# Patient Record
Sex: Female | Born: 1935 | Race: White | Hispanic: No | Smoking: Never smoker
Health system: Southern US, Community
[De-identification: ages and names within clinical notes are randomized; demographics above are authoritative.]

## PROBLEM LIST (undated history)

## (undated) DIAGNOSIS — I10 Essential (primary) hypertension: Secondary | ICD-10-CM

## (undated) DIAGNOSIS — R42 Dizziness and giddiness: Secondary | ICD-10-CM

## (undated) DIAGNOSIS — I219 Acute myocardial infarction, unspecified: Secondary | ICD-10-CM

## (undated) HISTORY — PX: HIP SURGERY: SHX245

---

## 2010-11-26 ENCOUNTER — Ambulatory Visit: Payer: Self-pay | Admitting: Family Medicine

## 2011-12-11 ENCOUNTER — Ambulatory Visit: Payer: Self-pay | Admitting: Family Medicine

## 2014-07-07 ENCOUNTER — Emergency Department: Payer: Self-pay | Admitting: Emergency Medicine

## 2014-07-07 ENCOUNTER — Emergency Department: Payer: Self-pay | Admitting: Student

## 2014-07-07 LAB — CBC WITH DIFFERENTIAL/PLATELET
BASOS ABS: 0.1 10*3/uL (ref 0.0–0.1)
Basophil %: 1 %
Eosinophil #: 0.1 10*3/uL (ref 0.0–0.7)
Eosinophil %: 1.4 %
HCT: 40.1 % (ref 35.0–47.0)
HGB: 13.1 g/dL (ref 12.0–16.0)
LYMPHS ABS: 1.3 10*3/uL (ref 1.0–3.6)
Lymphocyte %: 13.9 %
MCH: 31.5 pg (ref 26.0–34.0)
MCHC: 32.7 g/dL (ref 32.0–36.0)
MCV: 96 fL (ref 80–100)
MONO ABS: 0.6 x10 3/mm (ref 0.2–0.9)
MONOS PCT: 6.1 %
NEUTROS ABS: 7.2 10*3/uL — AB (ref 1.4–6.5)
Neutrophil %: 77.6 %
PLATELETS: 208 10*3/uL (ref 150–440)
RBC: 4.16 10*6/uL (ref 3.80–5.20)
RDW: 13.8 % (ref 11.5–14.5)
WBC: 9.3 10*3/uL (ref 3.6–11.0)

## 2014-07-07 LAB — COMPREHENSIVE METABOLIC PANEL
ALK PHOS: 49 U/L
ALT: 14 U/L
ANION GAP: 9 (ref 7–16)
Albumin: 4.1 g/dL
BUN: 31 mg/dL — AB
Bilirubin,Total: 0.4 mg/dL
CALCIUM: 9.3 mg/dL
CHLORIDE: 105 mmol/L
CO2: 26 mmol/L
Creatinine: 1.05 mg/dL — ABNORMAL HIGH
EGFR (African American): 55 — ABNORMAL LOW
GFR CALC NON AF AMER: 48 — AB
Glucose: 137 mg/dL — ABNORMAL HIGH
POTASSIUM: 3.8 mmol/L
SGOT(AST): 26 U/L
Sodium: 140 mmol/L
Total Protein: 7 g/dL

## 2014-07-07 LAB — URINALYSIS, COMPLETE
BLOOD: NEGATIVE
Bacteria: NONE SEEN
Bilirubin,UR: NEGATIVE
Glucose,UR: NEGATIVE mg/dL (ref 0–75)
Ketone: NEGATIVE
LEUKOCYTE ESTERASE: NEGATIVE
NITRITE: NEGATIVE
PH: 5 (ref 4.5–8.0)
Protein: NEGATIVE
RBC,UR: 1 /HPF (ref 0–5)
Specific Gravity: 1.014 (ref 1.003–1.030)
WBC UR: 2 /HPF (ref 0–5)

## 2014-07-07 LAB — TROPONIN I: Troponin-I: 0.03 ng/mL

## 2014-07-07 LAB — LIPASE, BLOOD: LIPASE: 35 U/L

## 2015-02-04 ENCOUNTER — Emergency Department: Payer: Medicare Other

## 2015-02-04 ENCOUNTER — Emergency Department
Admission: EM | Admit: 2015-02-04 | Discharge: 2015-02-04 | Disposition: A | Discharge status: Home / Self Care | Payer: Medicare Other | Attending: Emergency Medicine | Admitting: Emergency Medicine

## 2015-02-04 ENCOUNTER — Encounter: Payer: Self-pay | Admitting: Adult Health

## 2015-02-04 DIAGNOSIS — Y998 Other external cause status: Secondary | ICD-10-CM | POA: Diagnosis not present

## 2015-02-04 DIAGNOSIS — Z79899 Other long term (current) drug therapy: Secondary | ICD-10-CM | POA: Diagnosis not present

## 2015-02-04 DIAGNOSIS — W19XXXA Unspecified fall, initial encounter: Secondary | ICD-10-CM

## 2015-02-04 DIAGNOSIS — Y9389 Activity, other specified: Secondary | ICD-10-CM | POA: Insufficient documentation

## 2015-02-04 DIAGNOSIS — S0093XA Contusion of unspecified part of head, initial encounter: Secondary | ICD-10-CM

## 2015-02-04 DIAGNOSIS — S6992XA Unspecified injury of left wrist, hand and finger(s), initial encounter: Secondary | ICD-10-CM | POA: Insufficient documentation

## 2015-02-04 DIAGNOSIS — S7012XA Contusion of left thigh, initial encounter: Secondary | ICD-10-CM | POA: Insufficient documentation

## 2015-02-04 DIAGNOSIS — Z7982 Long term (current) use of aspirin: Secondary | ICD-10-CM | POA: Insufficient documentation

## 2015-02-04 DIAGNOSIS — S0083XA Contusion of other part of head, initial encounter: Secondary | ICD-10-CM | POA: Insufficient documentation

## 2015-02-04 DIAGNOSIS — S7002XA Contusion of left hip, initial encounter: Secondary | ICD-10-CM | POA: Insufficient documentation

## 2015-02-04 DIAGNOSIS — Y9289 Other specified places as the place of occurrence of the external cause: Secondary | ICD-10-CM | POA: Insufficient documentation

## 2015-02-04 DIAGNOSIS — W01198A Fall on same level from slipping, tripping and stumbling with subsequent striking against other object, initial encounter: Secondary | ICD-10-CM | POA: Insufficient documentation

## 2015-02-04 DIAGNOSIS — S0990XA Unspecified injury of head, initial encounter: Secondary | ICD-10-CM | POA: Diagnosis present

## 2015-02-04 HISTORY — DX: Acute myocardial infarction, unspecified: I21.9

## 2015-02-04 HISTORY — DX: Dizziness and giddiness: R42

## 2015-02-04 NOTE — ED Provider Notes (Signed)
[facility] Emergency Department Provider Note  ____________________________________________  Time seen: Approximately 6:39 AM  I have reviewed the triage vital signs and the nursing notes.   HISTORY  Chief Complaint Fall    HPI Leslie Huffman is a 81 y.o. female with past medical history that includes multiple orthopedic injuries but who takes no blood thinners and is relatively healthy for her age presents with pain in her left wrist, left hip, and the left side of the back of her head after rolling out of bed and striking the hardwood floor.  Reportedly she was trying to get up to go to the restroom when she slipped and fell.  She did not lose consciousness and denies any significant headache at this time.  She has no tenderness in the back of her neck or with any movement of her head and neck.  She has no obvious deformities of her extremities, just mild to moderate tenderness with range of motion of the major joints of the limbs described above.  She is concerned about her left hip that she has had a hip replacement in the past.  She is very comfortable at this time and denies any pain as long she is sitting still.  Movement makes the pain worse and remaining still makes it better.  She denies lightheadedness, dizziness, chest pain, shortness of breath, fever/chills, abdominal pain, dysuria.   Past Medical History  Diagnosis Date  . Vertigo   . Heart attack (HCC)     There are no active problems to display for this patient.   Past Surgical History  Procedure Laterality Date  . Hip surgery      Current Outpatient Rx  Name  Route  Sig  Dispense  Refill  . acetaminophen (TYLENOL) 650 MG CR tablet   Oral   Take 1,300 mg by mouth every 8 (eight) hours.         Marland Kitchen. aspirin EC 81 MG tablet   Oral   Take 1 tablet by mouth daily.         Marland Kitchen. docusate sodium (COLACE) 50 MG capsule   Oral   Take 50 mg by mouth 2 (two) times daily as needed for mild  constipation.         . fluticasone (FLONASE) 50 MCG/ACT nasal spray   Each Nare   Place 2 sprays into both nostrils daily.         Marland Kitchen. levothyroxine (SYNTHROID, LEVOTHROID) 50 MCG tablet   Oral   Take 1 tablet by mouth daily.         Marland Kitchen. lisinopril (PRINIVIL,ZESTRIL) 20 MG tablet   Oral   Take 1 tablet by mouth daily.         . meclizine (ANTIVERT) 25 MG tablet   Oral   Take 25-50 mg by mouth 4 (four) times daily as needed.         . metoprolol succinate (TOPROL-XL) 50 MG 24 hr tablet   Oral   Take 1 tablet by mouth daily.         . Multiple Vitamins-Minerals (OCUVITE ADULT 50+) CAPS   Oral   Take 1 capsule by mouth daily.         . ondansetron (ZOFRAN) 4 MG tablet   Oral   Take 1 tablet by mouth every 6 (six) hours as needed.         . triamterene-hydrochlorothiazide (MAXZIDE-25) 37.5-25 MG tablet   Oral   Take 1 tablet by mouth  daily.           Allergies Sulfa antibiotics  History reviewed. No pertinent family history.  Social History Social History  Substance Use Topics  . Smoking status: Never Smoker   . Smokeless tobacco: None  . Alcohol Use: No    Review of Systems Constitutional: No fever/chills Eyes: No visual changes. ENT: No sore throat. Cardiovascular: Denies chest pain. Respiratory: Denies shortness of breath. Gastrointestinal: No abdominal pain.  No nausea, no vomiting.  No diarrhea.  No constipation. Genitourinary: Negative for dysuria. Musculoskeletal: Pain/tenderness in her left wrist, left hip, and the left-sided back of her head Skin: Negative for rash. Neurological: Negative for headaches, focal weakness or numbness.  10-point ROS otherwise negative.  ____________________________________________   PHYSICAL EXAM:  VITAL SIGNS: ED Triage Vitals  Enc Vitals Group     BP 02/04/15 0308 182/73 mmHg     Pulse Rate 02/04/15 0308 54     Resp 02/04/15 0308 18     Temp 02/04/15 0308 99.3 F (37.4 C)     Temp Source  02/04/15 0308 Oral     SpO2 02/04/15 0308 95 %     Weight --      Height --      Head Cir --      Peak Flow --      Pain Score 02/04/15 0308 1     Pain Loc --      Pain Edu? --      Excl. in GC? --     Constitutional: Alert and oriented. Well appearing and in no acute distress. Eyes: Conjunctivae are normal. PERRL. EOMI. Head: Small contusion/hematoma posterior to her left ear near the left side of the back of her head.  No hemotympanum. Nose: No congestion/rhinnorhea. Mouth/Throat: Mucous membranes are moist.  Oropharynx non-erythematous. Neck: No stridor.  No cervical spine tenderness to palpation. Cardiovascular: Normal rate, regular rhythm. Grossly normal heart sounds.  Good peripheral circulation. Respiratory: Normal respiratory effort.  No retractions. Lungs CTAB. Gastrointestinal: Soft and nontender. No distention. No abdominal bruits. No CVA tenderness. Musculoskeletal: No gross deformities.  No pain or tenderness with range of motion of the affected joints.  Neurovascularly intact throughout. Neurologic:  Normal speech and language. No gross focal neurologic deficits are appreciated.  Skin:  Skin is warm, dry and intact. No rash noted. Psychiatric: Mood and affect are normal. Speech and behavior are normal.  ____________________________________________   LABS (all labs ordered are listed, but only abnormal results are displayed)  Labs Reviewed - No data to display ____________________________________________  EKG  Not indicated ____________________________________________  RADIOLOGY   Dg Wrist Complete Left  02/04/2015  CLINICAL DATA:  Left wrist pain after fall out of bed. EXAM: LEFT WRIST - COMPLETE 3+ VIEW COMPARISON:  None. FINDINGS: No acute fracture or dislocation. 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 Well corticated osseous density about the volar proximal carpal row, may reflect sequela of remote prior injury. No focal soft tissue abnormality.  IMPRESSION: No fracture or dislocation. 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 Electronically Signed   By: Rubye Oaks M.D.   On: 02/04/2015 03:56   Ct Head Wo Contrast  02/04/2015  CLINICAL DATA:  Rolled out of bed onto hardwood floor, with headache. Disoriented. Initial encounter. EXAM: CT HEAD WITHOUT CONTRAST TECHNIQUE: Contiguous axial images were obtained from the base of the skull through the vertex without intravenous contrast. COMPARISON:  CT of the head performed 07/07/2014 FINDINGS: There is no evidence  of acute infarction, mass lesion, or intra- or extra-axial hemorrhage on CT. Prominence of ventricles and sulci reflects moderate cortical volume loss. Cerebellar atrophy is noted. Mild periventricular and subcortical white matter change likely reflects small vessel ischemic microangiopathy. The brainstem and fourth ventricle are within normal limits. The basal ganglia are unremarkable in appearance. The cerebral hemispheres demonstrate grossly normal gray-white differentiation. No mass effect or midline shift is seen. There is no evidence of fracture; visualized osseous structures are unremarkable in appearance. The visualized portions of the orbits are within normal limits. The paranasal sinuses and mastoid air cells are well-aerated. No significant soft tissue abnormalities are seen. IMPRESSION: 1. No evidence of traumatic intracranial injury or fracture. 2. Moderate cortical volume loss and scattered small vessel ischemic microangiopathy. Electronically Signed   By: Roanna Raider M.D.   On: 02/04/2015 04:08   Dg Hip Unilat With Pelvis 2-3 Views Left  02/04/2015  CLINICAL DATA:  Acute onset of left lateral hip pain after rolling on bed onto hardwood floor. Initial encounter. EXAM: DG HIP (WITH OR WITHOUT PELVIS) 2-3V LEFT COMPARISON:  None. FINDINGS: There is no evidence of fracture or dislocation. The left femoral head remains seated at the acetabulum. Left femoral  hardware appears grossly intact, without evidence of loosening. Mild degenerative change is noted at the pubic symphysis. Mild joint space narrowing is seen at the right hip. Mild sclerotic change is noted at the sacroiliac joints. Mild degenerative change is noted at the lower lumbar spine. The visualized bowel gas pattern is grossly unremarkable. IMPRESSION: No evidence of fracture or dislocation. Left femoral hardware appears grossly intact, without evidence of loosening. Electronically Signed   By: Roanna Raider M.D.   On: 02/04/2015 03:55    ____________________________________________   PROCEDURES  Procedure(s) performed: None  Critical Care performed: No ____________________________________________   INITIAL IMPRESSION / ASSESSMENT AND PLAN / ED COURSE  Pertinent labs & imaging results that were available during my care of the patient were reviewed by me and considered in my medical decision making (see chart for details).  The patient is well-appearing and in no acute distress.  Her radiographs are reassuring.  There is no indication at this time to proceed with further evaluation.  The patient and her son agree with this plan.  I gave her my usual and customary return precautions.  ____________________________________________  FINAL CLINICAL IMPRESSION(S) / ED DIAGNOSES  Final diagnoses:  Fall      NEW MEDICATIONS STARTED DURING THIS VISIT:  New Prescriptions   No medications on file     Loleta Rose, MD 02/04/15 [phone]

## 2015-02-04 NOTE — ED Notes (Addendum)
Presents with wrist paain, left hip pain and head pain from rolling out of bed onto hard wood floot this evening. FAmily came to home and stated she was slightly disoriented . At this time alert, oreinted, answers all questions appropriately. denis use of blood thinners. No deformity to wrist, no shortening or rotation.  Endorses nausea.

## 2015-02-04 NOTE — Discharge Instructions (Signed)
You have been seen in the Emergency Department (ED) today for a fall.  Your work up does not show any concerning injuries.  Please take over-the-counter ibuprofen and/or Tylenol as needed for your pain (unless you have an allergy or your doctor as told you not to take them), or take any prescribed medication as instructed.  Please follow up with your doctor regarding today's Emergency Department (ED) visit and your recent fall.    Return to the ED if you have any headache, confusion, slurred speech, weakness/numbness of any arm or leg, or any increased pain.   Contusion A contusion is a deep bruise. Contusions are the result of a blunt injury to tissues and muscle fibers under the skin. The injury causes bleeding under the skin. The skin overlying the contusion may turn blue, purple, or yellow. Minor injuries will give you a painless contusion, but more severe contusions may stay painful and swollen for a few weeks.  CAUSES  This condition is usually caused by a blow, trauma, or direct force to an area of the body. SYMPTOMS  Symptoms of this condition include:  Swelling of the injured area.  Pain and tenderness in the injured area.  Discoloration. The area may have redness and then turn blue, purple, or yellow. DIAGNOSIS  This condition is diagnosed based on a physical exam and medical history. An X-ray, CT scan, or MRI may be needed to determine if there are any associated injuries, such as broken bones (fractures). TREATMENT  Specific treatment for this condition depends on what area of the body was injured. In general, the best treatment for a contusion is resting, icing, applying pressure to (compression), and elevating the injured area. This is often called the RICE strategy. Over-the-counter anti-inflammatory medicines may also be recommended for pain control.  HOME CARE INSTRUCTIONS   Rest the injured area.  If directed, apply ice to the injured area:  Put ice in a plastic  bag.  Place a towel between your skin and the bag.  Leave the ice on for 20 minutes, 2-3 times per day.  If directed, apply light compression to the injured area using an elastic bandage. Make sure the bandage is not wrapped too tightly. Remove and reapply the bandage as directed by your health care provider.  If possible, raise (elevate) the injured area above the level of your heart while you are sitting or lying down.  Take over-the-counter and prescription medicines only as told by your health care provider. SEEK MEDICAL CARE IF:  Your symptoms do not improve after several days of treatment.  Your symptoms get worse.  You have difficulty moving the injured area. SEEK IMMEDIATE MEDICAL CARE IF:   You have severe pain.  You have numbness in a hand or foot.  Your hand or foot turns pale or cold.   This information is not intended to replace advice given to you by your health care provider. Make sure you discuss any questions you have with your health care provider.   Document Released: 01/08/2005 Document Revised: 12/20/2014 Document Reviewed: 08/16/2014 Elsevier Interactive Patient Education 2016 [employer]  Facial or Scalp Contusion  A facial or scalp contusion is a deep bruise on the face or head. Contusions happen when an injury causes bleeding under the skin. Signs of bruising include pain, puffiness (swelling), and discolored skin. The contusion may turn blue, purple, or yellow. HOME CARE  Only take medicines as told by your doctor.  Put ice on the injured area.  Put ice in a plastic bag.  Place a towel between your skin and the bag.  Leave the ice on for 20 minutes, 2-3 times a day. GET HELP IF:  You have bite problems.  You have pain when chewing.  You are worried about your face not healing normally. GET HELP RIGHT AWAY IF:   You have severe pain or a headache and medicine does not help.  You are very tired or confused, or your personality  changes.  You throw up (vomit).  You have a nosebleed that will not stop.  You see two of everything (double vision) or have blurry vision.  You have fluid coming from your nose or ear.  You have problems walking or using your arms or legs. MAKE SURE YOU:   Understand these instructions.  Will watch your condition.  Will get help right away if you are not doing well or get worse.   This information is not intended to replace advice given to you by your health care provider. Make sure you discuss any questions you have with your health care provider.   Document Released: 03/20/2011 Document Revised: 04/21/2014 Document Reviewed: 11/11/2012 Elsevier Interactive Patient Education [employer] Elsevier Inc[employer]

## 2016-08-07 ENCOUNTER — Encounter: Payer: Self-pay | Admitting: Podiatry

## 2016-08-07 ENCOUNTER — Ambulatory Visit (INDEPENDENT_AMBULATORY_CARE_PROVIDER_SITE_OTHER): Payer: Medicare Other | Admitting: Podiatry

## 2016-08-07 VITALS — BP 149/75 | HR 64

## 2016-08-07 DIAGNOSIS — B351 Tinea unguium: Secondary | ICD-10-CM | POA: Diagnosis not present

## 2016-08-07 DIAGNOSIS — M79676 Pain in unspecified toe(s): Secondary | ICD-10-CM

## 2016-08-07 NOTE — Progress Notes (Signed)
   Subjective:    Patient ID: Leslie Huffman, female    DOB: 1924-08-09, 83 y.o.   MRN: [id number]  HPI This patient presents the office saying that her nails are thick and painful. She says that she is unable to self treat due to hip surgery on her left hip. She says the nails are painful walking and wearing her shoes. She presents the office today for an evaluation and treatment of her long thick nails   Review of Systems  All other systems reviewed and are negative.      Objective:   Physical Exam GENERAL APPEARANCE: Alert, conversant. Appropriately groomed. No acute distress.  VASCULAR: Pedal pulses are  palpable at   [facility] and PT bilateral.  Capillary refill time is immediate to all digits,  Normal temperature gradient.  Digital hair growth is present bilateral  NEUROLOGIC: sensation is normal to 5.07 monofilament at 5/5 sites bilateral.  Light touch is intact bilateral, Muscle strength normal.  MUSCULOSKELETAL: acceptable muscle strength, tone and stability bilateral.  HAV  B/L  DERMATOLOGIC: skin color, texture, and turgor are within normal limits.  No preulcerative lesions or ulcers  are seen, no interdigital maceration noted.  No open lesions present.   No drainage noted.  NAILS  Thick disfigured discolored nails both feet.         Assessment & Plan:  Onychomycosis  B/L  IE  Debride nails  B/L  RTC 3 months.   Helane Gunther DPM

## 2016-11-06 ENCOUNTER — Encounter: Payer: Self-pay | Admitting: Podiatry

## 2016-11-06 ENCOUNTER — Ambulatory Visit (INDEPENDENT_AMBULATORY_CARE_PROVIDER_SITE_OTHER): Payer: Medicare Other | Admitting: Podiatry

## 2016-11-06 DIAGNOSIS — B351 Tinea unguium: Secondary | ICD-10-CM

## 2016-11-06 DIAGNOSIS — M79676 Pain in unspecified toe(s): Secondary | ICD-10-CM

## 2016-11-06 NOTE — Progress Notes (Signed)
   Subjective:    Patient ID: Clydie BraunMary S Waldridge, female    DOB: 09/27/1924, 83 y.o.   MRN: [id number]  HPI This patient presents the office saying that her nails are thick and painful. She says that she is unable to self treat due to hip surgery on her left hip. She says the nails are painful walking and wearing her shoes. She presents the office today for an evaluation and treatment of her long thick nails   Review of Systems  All other systems reviewed and are negative.      Objective:   Physical Exam GENERAL APPEARANCE: Alert, conversant. Appropriately groomed. No acute distress.  VASCULAR: Pedal pulses are  palpable at  [facility].  Capillary refill time is immediate to all digits,  Normal temperature gradient.  NEUROLOGIC: sensation is normal to 5.07 monofilament at 5/5 sites bilateral.  Light touch is intact bilateral, Muscle strength normal.  MUSCULOSKELETAL: acceptable muscle strength, tone and stability bilateral.  HAV  B/L  DERMATOLOGIC: skin color, texture, and turgor are within normal limits.  No preulcerative lesions or ulcers  are seen, no interdigital maceration noted.  No open lesions present.   No drainage noted.  NAILS  Thick disfigured discolored  Hallux  nails both feet.         Assessment & Plan:  Onychomycosis  B/L  IE  Debride nails  B/L  RTC 3 months.   Helane GuntherGregory Travanti Mcmanus DPM

## 2017-01-29 ENCOUNTER — Encounter: Payer: Self-pay | Admitting: Podiatry

## 2017-01-29 ENCOUNTER — Ambulatory Visit (INDEPENDENT_AMBULATORY_CARE_PROVIDER_SITE_OTHER): Payer: Medicare Other | Admitting: Podiatry

## 2017-01-29 DIAGNOSIS — B351 Tinea unguium: Secondary | ICD-10-CM | POA: Diagnosis not present

## 2017-01-29 DIAGNOSIS — M79676 Pain in unspecified toe(s): Secondary | ICD-10-CM | POA: Diagnosis not present

## 2017-01-29 NOTE — Progress Notes (Addendum)
   Subjective:    Patient ID: Leslie Huffman, female    DOB: [birth year], 83 y.o.   MRN: [id number]  HPI This patient presents the office saying that her nails are thick and painful. She says that she is unable to self treat due to hip surgery on her left hip. She says the nails are painful walking and wearing her shoes. She presents the office today for an evaluation and treatment of her long thick nails   Review of Systems  All other systems reviewed and are negative.      Objective:   Physical Exam GENERAL APPEARANCE: Alert, conversant. Appropriately groomed. No acute distress.  VASCULAR: Pedal pulses are  palpable at  [facility].  Capillary refill time is immediate to all digits,  Normal temperature gradient.  NEUROLOGIC: sensation is normal to 5.07 monofilament at 5/5 sites bilateral.  Light touch is intact bilateral, Muscle strength normal.  MUSCULOSKELETAL: acceptable muscle strength, tone and stability bilateral.  HAV  B/L  DERMATOLOGIC: skin color, texture, and turgor are within normal limits.  No preulcerative lesions or ulcers  are seen, no interdigital maceration noted.  No open lesions present.   No drainage noted.  NAILS  Thick disfigured discolored  Hallux  nails both feet.         Assessment & Plan:  Onychomycosis  B/L  Debride nails  B/L  RTC 3 months.  ABN signed for 2018.   Helane GuntherGregory Vincenza Dail DPM

## 2017-02-28 ENCOUNTER — Emergency Department
Admission: EM | Admit: 2017-02-28 | Discharge: 2017-02-28 | Disposition: A | Discharge status: Home / Self Care | Payer: Medicare Other | Attending: Emergency Medicine | Admitting: Emergency Medicine

## 2017-02-28 ENCOUNTER — Encounter: Payer: Self-pay | Admitting: Emergency Medicine

## 2017-02-28 ENCOUNTER — Other Ambulatory Visit: Payer: Self-pay

## 2017-02-28 ENCOUNTER — Emergency Department: Payer: Medicare Other

## 2017-02-28 DIAGNOSIS — S7002XA Contusion of left hip, initial encounter: Secondary | ICD-10-CM | POA: Insufficient documentation

## 2017-02-28 DIAGNOSIS — S51812A Laceration without foreign body of left forearm, initial encounter: Secondary | ICD-10-CM | POA: Insufficient documentation

## 2017-02-28 DIAGNOSIS — Y999 Unspecified external cause status: Secondary | ICD-10-CM | POA: Insufficient documentation

## 2017-02-28 DIAGNOSIS — I10 Essential (primary) hypertension: Secondary | ICD-10-CM | POA: Diagnosis not present

## 2017-02-28 DIAGNOSIS — Z79899 Other long term (current) drug therapy: Secondary | ICD-10-CM | POA: Insufficient documentation

## 2017-02-28 DIAGNOSIS — R52 Pain, unspecified: Secondary | ICD-10-CM

## 2017-02-28 DIAGNOSIS — W19XXXA Unspecified fall, initial encounter: Secondary | ICD-10-CM

## 2017-02-28 DIAGNOSIS — W01198A Fall on same level from slipping, tripping and stumbling with subsequent striking against other object, initial encounter: Secondary | ICD-10-CM | POA: Insufficient documentation

## 2017-02-28 DIAGNOSIS — Z7982 Long term (current) use of aspirin: Secondary | ICD-10-CM | POA: Insufficient documentation

## 2017-02-28 DIAGNOSIS — Y9389 Activity, other specified: Secondary | ICD-10-CM | POA: Insufficient documentation

## 2017-02-28 DIAGNOSIS — S59912A Unspecified injury of left forearm, initial encounter: Secondary | ICD-10-CM | POA: Diagnosis present

## 2017-02-28 DIAGNOSIS — Y929 Unspecified place or not applicable: Secondary | ICD-10-CM | POA: Diagnosis not present

## 2017-02-28 HISTORY — DX: Essential (primary) hypertension: I10

## 2017-02-28 NOTE — ED Triage Notes (Signed)
Lost balance and fell approx 1 hour ago, small laceration L forearm with minimal bleeding at present. Covered with gauze in triage.

## 2017-02-28 NOTE — ED Provider Notes (Signed)
[facility] Emergency Department Provider Note   ____________________________________________   None    (approximate)  I have reviewed the triage vital signs and the nursing notes.   HISTORY  Chief Complaint Laceration    HPI Leslie Huffman is a 83 y.o. female patient presents with a skin tear to the left forearm which happened approximately one hour ago. Patient states she loss of balance in toto skin against a dresser. Patient stated there is mild to moderate left hip pain secondary to fall. Minimal bleeding noted and was controlled with direct pressure. Patient denies loss of sensation or loss of function of the affected extremity. Patient rates the pain as a 1/10.  Past Medical History:  Diagnosis Date  . Heart attack (HCC)   . Hypertension   . Vertigo     There are no active problems to display for this patient.   Past Surgical History:  Procedure Laterality Date  . HIP SURGERY      Prior to Admission medications   Medication Sig Start Date End Date Taking? Authorizing Provider  acetaminophen (TYLENOL) 650 MG CR tablet Take 1,300 mg by mouth every 8 (eight) hours.    [provider]  aspirin EC 81 MG tablet Take 1 tablet by mouth daily.    [provider]  docusate sodium (COLACE) 50 MG capsule Take 50 mg by mouth 2 (two) times daily as needed for mild constipation.    [provider]  fluticasone (FLONASE) 50 MCG/ACT nasal spray Place 2 sprays into both nostrils daily.    [provider]  levothyroxine (SYNTHROID, LEVOTHROID) 50 MCG tablet Take 1 tablet by mouth daily. 02/02/15   [provider]  lisinopril (PRINIVIL,ZESTRIL) 20 MG tablet Take 1 tablet by mouth daily. 02/02/15   [provider]  meclizine (ANTIVERT) 25 MG tablet Take 25-50 mg by mouth 4 (four) times daily as needed. 09/18/14   [provider]  metoprolol succinate (TOPROL-XL) 50 MG 24 hr tablet Take 1 tablet by mouth  daily. 02/02/15   [provider]  Multiple Vitamins-Minerals (OCUVITE ADULT 50+) CAPS Take 1 capsule by mouth daily.    [provider]  ondansetron (ZOFRAN) 4 MG tablet Take 1 tablet by mouth every 6 (six) hours as needed. 09/18/14   [provider]  triamterene-hydrochlorothiazide (MAXZIDE-25) 37.5-25 MG tablet Take 1 tablet by mouth daily. 02/02/15   [provider]    Allergies Sulfa antibiotics  No family history on file.  Social History Social History   Tobacco Use  . Smoking status: Never Smoker  . Smokeless tobacco: Never Used  Substance Use Topics  . Alcohol use: No  . Drug use: No    Review of Systems  Constitutional: No fever/chills Eyes: No visual changes. ENT: No sore throat. Cardiovascular: Denies chest pain. Respiratory: Denies shortness of breath. Gastrointestinal: No abdominal pain.  No nausea, no vomiting.  No diarrhea.  No constipation. Genitourinary: Negative for dysuria. Musculoskeletal: Negative for back pain. Skin: Negative for rash. Neurological: Negative for headaches, focal weakness or numbness. Vertigo Endocrine:Hypertension Allergic/Immunilogical: Sulfa antibiotics  ____________________________________________   PHYSICAL EXAM:  VITAL SIGNS: ED Triage Vitals  Enc Vitals Group     BP 02/28/17 1746 (!) 151/57     Pulse Rate 02/28/17 1746 60     Resp 02/28/17 1746 18     Temp 02/28/17 1746 99 F (37.2 C)     Temp Source 02/28/17 1746 Oral     SpO2 02/28/17 1746  94 %     Weight 02/28/17 1749 136 lb (61.7 kg)     Height 02/28/17 1749 5\' 2"  (1.575 m)     Head Circumference --      Peak Flow --      Pain Score 02/28/17 1821 1     Pain Loc --      Pain Edu? --      Excl. in GC? --     Constitutional: Alert and oriented. Well appearing and in no acute distress. Eyes: Conjunctivae are normal. PERRL. EOMI. Head: Atraumatic. Neck: No stridor.  No cervical spine tenderness to  palpation. Cardiovascular: Normal rate, regular rhythm. Grossly normal heart sounds.  Good peripheral circulation. Respiratory: Normal respiratory effort.  No retractions. Lungs CTAB. Musculoskeletal: No lower extremity tenderness nor edema.  No joint effusions. Neurologic:  Normal speech and language. No gross focal neurologic deficits are appreciated. No gait instability. Skin:  Skin is warm, dry and intact. No rash noted. 2.5 cm skin tear left forearm Psychiatric: Mood and affect are normal. Speech and behavior are normal.  ____________________________________________   LABS (all labs ordered are listed, but only abnormal results are displayed)  Labs Reviewed - No data to display ____________________________________________  EKG   ____________________________________________  RADIOLOGY  Dg Hip Unilat With Pelvis 2-3 Views Left  Result Date: 02/28/2017 CLINICAL DATA:  C/o left hip pain after losing balance and falling this PM EXAM: DG HIP (WITH OR WITHOUT PELVIS) 2-3V LEFT COMPARISON:  Plain film of the pelvis and left hip dated 02/04/2015. FINDINGS: Intramedullary rods and fixation screws in the proximal left femur appear intact and stable in alignment. No evidence of hardware loosening. No evidence of acute osseous fracture or dislocation at the left hip. Left femoral head remains well positioned relative to the acetabulum. Remainder of the osseous pelvis also appears intact and normally aligned. Soft tissues about the pelvis and left hip are unremarkable. IMPRESSION: No acute findings. No acute osseous fracture or dislocation. Fixation hardware at the left hip appears intact and stable in position. Electronically Signed   By: Bary Richard M.D.   On: 02/28/2017 19:19    ____________________________________________   PROCEDURES  Procedure(s) performed: None  .Marland KitchenLaceration Repair Date/Time: 02/28/2017 6:36 PM Performed by: Joni Reining, PA-C Authorized by: Joni Reining,  PA-C   Consent:    Consent obtained:  Verbal   Consent given by:  Patient   Risks discussed:  Infection Laceration details:    Location:  Shoulder/arm   Shoulder/arm location:  L lower arm   Length (cm):  2 Repair type:    Repair type:  Simple Pre-procedure details:    Preparation:  Patient was prepped and draped in usual sterile fashion Exploration:    Hemostasis achieved with:  Direct pressure   Wound extent: no foreign bodies/material noted, no muscle damage noted, no nerve damage noted, no tendon damage noted, no underlying fracture noted and no vascular damage noted     Contaminated: no   Treatment:    Area cleansed with:  Betadine and saline   Amount of cleaning:  Standard   Irrigation volume:  20 ML's   Irrigation method:  Syringe Skin repair:    Repair method:  Tissue adhesive Approximation:    Approximation:  Close Post-procedure details:    Dressing:  Adhesive bandage   Patient tolerance of procedure:  Tolerated well, no immediate complications    Critical Care performed: No  ____________________________________________   INITIAL IMPRESSION / ASSESSMENT AND PLAN /  ED COURSE  As part of my medical decision making, I reviewed the following data within the electronic MEDICAL RECORD NUMBER    Skin tears to left forearm. Area was surgical clean and closed with Dermabond. Discussed x-ray findings of the left hip which shows no abnormalities. Internal fixations aren't alignment. Patient given discharge care instructions. Patient advised to follow up PCP as needed.      ____________________________________________   FINAL CLINICAL IMPRESSION(S) / ED DIAGNOSES  Final diagnoses:  Forearm laceration, left, initial encounter  Contusion of left hip, initial encounter     ED Discharge Orders    None       Note:  This document was prepared using Dragon voice recognition software and may include unintentional dictation errors.    Joni ReiningSmith, Ronald K,  PA-C 02/28/17 1842    Joni ReiningSmith, Ronald K, PA-C 02/28/17 Elenore Paddy1935    McShane, James A, MD 02/28/17 [phone]

## 2017-05-04 ENCOUNTER — Ambulatory Visit: Payer: Medicare Other | Admitting: Podiatry

## 2017-05-07 ENCOUNTER — Ambulatory Visit (INDEPENDENT_AMBULATORY_CARE_PROVIDER_SITE_OTHER): Payer: Medicare Other | Admitting: Podiatry

## 2017-05-07 ENCOUNTER — Encounter: Payer: Self-pay | Admitting: Podiatry

## 2017-05-07 DIAGNOSIS — B351 Tinea unguium: Secondary | ICD-10-CM

## 2017-05-07 DIAGNOSIS — M79676 Pain in unspecified toe(s): Secondary | ICD-10-CM

## 2017-05-07 NOTE — Progress Notes (Signed)
Complaint:  Visit Type: Patient returns to my office for continued preventative foot care services. Complaint: Patient states" my nails have grown long and thick and become painful to walk and wear shoes" Patient has history of left hip surgery. The patient presents for preventative foot care services. No changes to ROS  Podiatric Exam: Vascular: dorsalis pedis and posterior tibial pulses are palpable bilateral. Capillary return is immediate. Temperature gradient is WNL. Skin turgor WNL  Sensorium: Normal Semmes Weinstein monofilament test. Normal tactile sensation bilaterally. Nail Exam: Pt has thick disfigured discolored nails with subungual debris noted bilateral entire nail hallux  Ulcer Exam: There is no evidence of ulcer or pre-ulcerative changes or infection. Orthopedic Exam: Muscle tone and strength are WNL. No limitations in general ROM. No crepitus or effusions noted. Foot type and digits show no abnormalities.  HAV  B/L. Skin: No Porokeratosis. No infection or ulcers  Diagnosis:  Onychomycosis, , Pain in right toe, pain in left toes  Treatment & Plan Procedures and Treatment: Consent by patient was obtained for treatment procedures.   Debridement of mycotic and hypertrophic toenails, 1 through 5 bilateral and clearing of subungual debris. No ulceration, no infection noted.  Return Visit-Office Procedure: Patient instructed to return to the office for a follow up visit 3 months for continued evaluation and treatment.    Deneise Getty DPM 

## 2017-08-06 ENCOUNTER — Encounter: Payer: Self-pay | Admitting: Podiatry

## 2017-08-06 ENCOUNTER — Ambulatory Visit (INDEPENDENT_AMBULATORY_CARE_PROVIDER_SITE_OTHER): Payer: Medicare Other | Admitting: Podiatry

## 2017-08-06 DIAGNOSIS — B351 Tinea unguium: Secondary | ICD-10-CM | POA: Diagnosis not present

## 2017-08-06 DIAGNOSIS — M79676 Pain in unspecified toe(s): Secondary | ICD-10-CM | POA: Diagnosis not present

## 2017-08-06 NOTE — Progress Notes (Signed)
Complaint:  Visit Type: Patient returns to my office for continued preventative foot care services. Complaint: Patient states" my nails have grown long and thick and become painful to walk and wear shoes" Patient has history of left hip surgery. The patient presents for preventative foot care services. No changes to ROS  Podiatric Exam: Vascular: dorsalis pedis and posterior tibial pulses are palpable bilateral. Capillary return is immediate. Temperature gradient is WNL. Skin turgor WNL  Sensorium: Normal Semmes Weinstein monofilament test. Normal tactile sensation bilaterally. Nail Exam: Pt has thick disfigured discolored nails with subungual debris noted bilateral entire nail hallux  Ulcer Exam: There is no evidence of ulcer or pre-ulcerative changes or infection. Orthopedic Exam: Muscle tone and strength are WNL. No limitations in general ROM. No crepitus or effusions noted. Foot type and digits show no abnormalities.  HAV  B/L. Skin: No Porokeratosis. No infection or ulcers  Diagnosis:  Onychomycosis, , Pain in right toe, pain in left toes  Treatment & Plan Procedures and Treatment: Consent by patient was obtained for treatment procedures.   Debridement of mycotic and hypertrophic toenails, 1 through 5 bilateral and clearing of subungual debris. No ulceration, no infection noted.  Return Visit-Office Procedure: Patient instructed to return to the office for a follow up visit 3 months for continued evaluation and treatment.    Loman Logan DPM 

## 2017-10-19 ENCOUNTER — Encounter: Payer: Self-pay | Admitting: Podiatry

## 2017-10-19 ENCOUNTER — Ambulatory Visit (INDEPENDENT_AMBULATORY_CARE_PROVIDER_SITE_OTHER): Payer: Medicare Other | Admitting: Podiatry

## 2017-10-19 DIAGNOSIS — B351 Tinea unguium: Secondary | ICD-10-CM

## 2017-10-19 DIAGNOSIS — M79676 Pain in unspecified toe(s): Secondary | ICD-10-CM | POA: Diagnosis not present

## 2017-10-19 NOTE — Progress Notes (Signed)
Complaint:  Visit Type: Patient returns to my office for continued preventative foot care services. Complaint: Patient states" my nails have grown long and thick and become painful to walk and wear shoes" Patient has history of left hip surgery. The patient presents for preventative foot care services. No changes to ROS  Podiatric Exam: Vascular: dorsalis pedis and posterior tibial pulses are palpable bilateral. Capillary return is immediate. Temperature gradient is WNL. Skin turgor WNL  Sensorium: Normal Semmes Weinstein monofilament test. Normal tactile sensation bilaterally. Nail Exam: Pt has thick disfigured discolored nails with subungual debris noted bilateral entire nail hallux  Ulcer Exam: There is no evidence of ulcer or pre-ulcerative changes or infection. Orthopedic Exam: Muscle tone and strength are WNL. No limitations in general ROM. No crepitus or effusions noted. Foot type and digits show no abnormalities.  HAV  B/L. Skin: No Porokeratosis. No infection or ulcers  Diagnosis:  Onychomycosis, , Pain in right toe, pain in left toes  Treatment & Plan Procedures and Treatment: Consent by patient was obtained for treatment procedures.   Debridement of mycotic and hypertrophic toenails, 1 through 5 bilateral and clearing of subungual debris. No ulceration, no infection noted.  Return Visit-Office Procedure: Patient instructed to return to the office for a follow up visit 3 months for continued evaluation and treatment.    Ashanti Ratti DPM 

## 2017-11-24 ENCOUNTER — Other Ambulatory Visit: Payer: Self-pay

## 2017-11-24 ENCOUNTER — Emergency Department: Payer: Medicare Other

## 2017-11-24 ENCOUNTER — Encounter: Payer: Self-pay | Admitting: Emergency Medicine

## 2017-11-24 ENCOUNTER — Emergency Department
Admission: EM | Admit: 2017-11-24 | Discharge: 2017-11-24 | Disposition: A | Discharge status: Home / Self Care | Payer: Medicare Other | Attending: Emergency Medicine | Admitting: Emergency Medicine

## 2017-11-24 DIAGNOSIS — E860 Dehydration: Secondary | ICD-10-CM | POA: Insufficient documentation

## 2017-11-24 DIAGNOSIS — Z7982 Long term (current) use of aspirin: Secondary | ICD-10-CM | POA: Insufficient documentation

## 2017-11-24 DIAGNOSIS — I10 Essential (primary) hypertension: Secondary | ICD-10-CM | POA: Insufficient documentation

## 2017-11-24 DIAGNOSIS — R4182 Altered mental status, unspecified: Secondary | ICD-10-CM | POA: Diagnosis present

## 2017-11-24 DIAGNOSIS — Z79899 Other long term (current) drug therapy: Secondary | ICD-10-CM | POA: Insufficient documentation

## 2017-11-24 DIAGNOSIS — R531 Weakness: Secondary | ICD-10-CM | POA: Insufficient documentation

## 2017-11-24 LAB — GLUCOSE, CAPILLARY: Glucose-Capillary: 120 mg/dL — ABNORMAL HIGH (ref 70–99)

## 2017-11-24 LAB — DIFFERENTIAL
Basophils Absolute: 0.1 10*3/uL (ref 0–0.1)
Basophils Relative: 1 %
Eosinophils Absolute: 0.3 10*3/uL (ref 0–0.7)
Eosinophils Relative: 5 %
LYMPHS PCT: 30 %
Lymphs Abs: 1.5 10*3/uL (ref 1.0–3.6)
MONO ABS: 0.6 10*3/uL (ref 0.2–0.9)
MONOS PCT: 11 %
NEUTROS ABS: 2.7 10*3/uL (ref 1.4–6.5)
Neutrophils Relative %: 53 %

## 2017-11-24 LAB — COMPREHENSIVE METABOLIC PANEL
ALK PHOS: 47 U/L (ref 38–126)
ALT: 11 U/L (ref 0–44)
AST: 19 U/L (ref 15–41)
Albumin: 3.7 g/dL (ref 3.5–5.0)
Anion gap: 9 (ref 5–15)
BILIRUBIN TOTAL: 0.7 mg/dL (ref 0.3–1.2)
BUN: 41 mg/dL — ABNORMAL HIGH (ref 8–23)
CALCIUM: 9.3 mg/dL (ref 8.9–10.3)
CO2: 27 mmol/L (ref 22–32)
CREATININE: 1.49 mg/dL — AB (ref 0.44–1.00)
Chloride: 102 mmol/L (ref 98–111)
GFR, EST AFRICAN AMERICAN: 34 mL/min — AB (ref >60)
GFR, EST NON AFRICAN AMERICAN: 30 mL/min — AB (ref >60)
Glucose, Bld: 119 mg/dL — ABNORMAL HIGH (ref 70–99)
Potassium: 3.9 mmol/L (ref 3.5–5.1)
Sodium: 138 mmol/L (ref 135–145)
Total Protein: 6.5 g/dL (ref 6.5–8.1)

## 2017-11-24 LAB — URINALYSIS, COMPLETE (UACMP) WITH MICROSCOPIC
Bacteria, UA: NONE SEEN
Bilirubin Urine: NEGATIVE
Glucose, UA: NEGATIVE mg/dL
Hgb urine dipstick: NEGATIVE
Ketones, ur: NEGATIVE mg/dL
Leukocytes, UA: NEGATIVE
Nitrite: NEGATIVE
Protein, ur: NEGATIVE mg/dL
Specific Gravity, Urine: 1.015 (ref 1.005–1.030)
pH: 6 (ref 5.0–8.0)

## 2017-11-24 LAB — CBC
HEMATOCRIT: 31.1 % — AB (ref 35.0–47.0)
Hemoglobin: 10.9 g/dL — ABNORMAL LOW (ref 12.0–16.0)
MCH: 33.9 pg (ref 26.0–34.0)
MCHC: 35 g/dL (ref 32.0–36.0)
MCV: 96.9 fL (ref 80.0–100.0)
Platelets: 200 10*3/uL (ref 150–440)
RBC: 3.21 MIL/uL — AB (ref 3.80–5.20)
RDW: 13.8 % (ref 11.5–14.5)
WBC: 5.1 10*3/uL (ref 3.6–11.0)

## 2017-11-24 LAB — TROPONIN I: Troponin I: <0.03 ng/mL

## 2017-11-24 MED ORDER — SODIUM CHLORIDE 0.9 % IV SOLN
1000.0000 mL | Freq: Once | INTRAVENOUS | Status: AC
  Administered 2017-11-24: 1000 mL via INTRAVENOUS

## 2017-11-24 NOTE — ED Provider Notes (Signed)
[facility] Emergency Department Provider Note   ____________________________________________    I have reviewed the triage vital signs and the nursing notes.   HISTORY  Chief Complaint Altered Mental Status     HPI Leslie Huffman is a 84 y.o. female who presents with reported altered mental status.  Son reports that the patient was altered when he went to go pick her up to take her to the dermatologist today at 3 PM.  Apparently at 10 AM this morning she was at her baseline.  He notes that she seemed quite confused but denies that she had extremity weakness or facial droop.  No reports of fevers.  Has had a UTI in the past with similar symptoms.  Apparently walks nearly half a mile yesterday so he is concerned she may be dehydrated.  The patient states that she feels fine and is not even sure why she is in the emergency department   Past Medical History:  Diagnosis Date  . Heart attack (HCC)   . Hypertension   . Vertigo     There are no active problems to display for this patient.   Past Surgical History:  Procedure Laterality Date  . HIP SURGERY      Prior to Admission medications   Medication Sig Start Date End Date Taking? Authorizing Provider  acetaminophen (TYLENOL) 500 MG tablet Take 1,000 mg by mouth every 6 (six) hours as needed for moderate pain.   Yes [provider]  aspirin EC 81 MG tablet Take 81 mg by mouth daily.    Yes [provider]  calcium citrate-vitamin D 500-400 MG-UNIT chewable tablet Chew 1 tablet by mouth daily.   Yes [provider]  levothyroxine (SYNTHROID, LEVOTHROID) 50 MCG tablet Take 50 mcg by mouth daily.    Yes [provider]  lisinopril (PRINIVIL,ZESTRIL) 20 MG tablet Take 20 mg by mouth daily.  02/02/15  Yes [provider]  metoprolol succinate (TOPROL-XL) 50 MG 24 hr tablet Take 50 mg by mouth daily.    Yes [provider]  Multiple Vitamins-Minerals  (PRESERVISION AREDS) TABS Take 1 tablet by mouth daily.   Yes [provider]  triamterene-hydrochlorothiazide (MAXZIDE-25) 37.5-25 MG tablet Take 1 tablet by mouth daily. 02/02/15  Yes [provider]     Allergies Sulfa antibiotics  No family history on file.  Social History Social History   Tobacco Use  . Smoking status: Never Smoker  . Smokeless tobacco: Never Used  Substance Use Topics  . Alcohol use: No  . Drug use: No    Review of Systems  Constitutional: No fever/chills Eyes: No visual changes.  ENT: No sore throat. Cardiovascular: Denies chest pain. Respiratory: Denies shortness of breath. Gastrointestinal: No abdominal pain.   Genitourinary: Negative for dysuria. Musculoskeletal: Negative for back pain. Skin: Negative for rash. Neurological: Negative for headaches    ____________________________________________   PHYSICAL EXAM:  VITAL SIGNS: ED Triage Vitals  Enc Vitals Group     BP 11/24/17 1618 134/62     Pulse Rate 11/24/17 1618 60     Resp 11/24/17 1618 16     Temp 11/24/17 1618 98.4 F (36.9 C)     Temp Source 11/24/17 1618 Oral     SpO2 11/24/17 1618 93 %     Weight 11/24/17 1619 56.7 kg (125 lb)     Height 11/24/17 1619 1.524 m (5')     Head Circumference --      Peak  Flow --      Pain Score 11/24/17 1618 0     Pain Loc --      Pain Edu? --      Excl. in GC? --     Constitutional: Alert No acute distress. Pleasant and interactive Eyes: Conjunctivae are normal.   Nose: No congestion/rhinnorhea. Mouth/Throat: Mucous membranes are moist.    Cardiovascular: Normal rate, regular rhythm. Grossly normal heart sounds.  Good peripheral circulation. Respiratory: Normal respiratory effort.  No retractions. Lungs CTAB. Gastrointestinal: Soft and nontender. No distention.    Musculoskeletal:   Warm and well perfused, normal strength in all extremities Neurologic:  Normal speech and language. No gross focal neurologic deficits  are appreciated.  Skin:  Skin is warm, dry and intact. No rash noted. Psychiatric: Mood and affect are normal. Speech and behavior are normal.  ____________________________________________   LABS (all labs ordered are listed, but only abnormal results are displayed)  Labs Reviewed  GLUCOSE, CAPILLARY - Abnormal; Notable for the following components:      Result Value   Glucose-Capillary 120 (*)    All other components within normal limits  CBC - Abnormal; Notable for the following components:   RBC 3.21 (*)    Hemoglobin 10.9 (*)    HCT 31.1 (*)    All other components within normal limits  COMPREHENSIVE METABOLIC PANEL - Abnormal; Notable for the following components:   Glucose, Bld 119 (*)    BUN 41 (*)    Creatinine, Ser 1.49 (*)    GFR calc non Af Amer 30 (*)    GFR calc Af Amer 34 (*)    All other components within normal limits  URINALYSIS, COMPLETE (UACMP) WITH MICROSCOPIC - Abnormal; Notable for the following components:   Color, Urine YELLOW (*)    APPearance CLEAR (*)    All other components within normal limits  DIFFERENTIAL  TROPONIN I  CBG MONITORING, ED   ____________________________________________  EKG  ED ECG REPORT I, Jene Everyobert Ellisha Bankson, the attending physician, personally viewed and interpreted this ECG.  Date: 11/24/2017  Rhythm: normal sinus rhythm QRS Axis: normal Intervals: Left bundle branch block ST/T Wave abnormalities: normal Narrative Interpretation: No significant change from prior EKG  ____________________________________________  RADIOLOGY  CT head unremarkable ____________________________________________   PROCEDURES  Procedure(s) performed: No  Procedures   Critical Care performed: No ____________________________________________   INITIAL IMPRESSION / ASSESSMENT AND PLAN / ED COURSE  Pertinent labs & imaging results that were available during my care of the patient were reviewed by me and considered in my medical  decision making (see chart for details).  Patient presents with reports of altered mental status.  She appears to be at her baseline currently.  CT scan is unremarkable.  Neuro exam is reassuring.  Question metabolic encephalopathy, possibly from urinary tract infection.  Lab work is overall quite reassuring, mildly dehydrated based on BUN and creatinine.  IV fluids infusing.  Urinalysis is unremarkable.  After IV fluid patient much better appearing, family is commenting that she appears to be at her baseline and that "her color is better ".  They are quite reassured and feel comfortable taking her home.  Return precautions discussed follow-up with PCP    ____________________________________________   FINAL CLINICAL IMPRESSION(S) / ED DIAGNOSES  Final diagnoses:  Dehydration  Altered mental status, unspecified altered mental status type        Note:  This document was prepared using Dragon voice recognition software and may include unintentional dictation errors.  Jene EveryKinner, Ngai Parcell, MD 11/24/17 [phone]

## 2017-11-24 NOTE — ED Triage Notes (Signed)
Per patient's family, patient was supposed to have appointment today and when family called to see if she was ready, got no answer. Family states that upon arrival to pick her up, patient was laying in bed, dressed, but states that she was not able to get out of bed because she was so weak. Per family, patient is normally independent and alert and oriented. Patient was last seen well by home health at 10 am. Mild right-sided facial droop noted and son states he is unsure if this is baseline for patient. No weakness noted.

## 2017-12-21 ENCOUNTER — Ambulatory Visit (INDEPENDENT_AMBULATORY_CARE_PROVIDER_SITE_OTHER): Payer: Medicare Other | Admitting: Podiatry

## 2017-12-21 ENCOUNTER — Encounter: Payer: Self-pay | Admitting: Podiatry

## 2017-12-21 DIAGNOSIS — B351 Tinea unguium: Secondary | ICD-10-CM | POA: Diagnosis not present

## 2017-12-21 DIAGNOSIS — M79676 Pain in unspecified toe(s): Secondary | ICD-10-CM | POA: Diagnosis not present

## 2017-12-21 NOTE — Progress Notes (Signed)
Complaint:  Visit Type: Patient returns to my office for continued preventative foot care services. Complaint: Patient states" my nails have grown long and thick and become painful to walk and wear shoes" Patient has history of left hip surgery. The patient presents for preventative foot care services. No changes to ROS  Podiatric Exam: Vascular: dorsalis pedis and posterior tibial pulses are palpable bilateral. Capillary return is immediate. Temperature gradient is WNL. Skin turgor WNL  Sensorium: Normal Semmes Weinstein monofilament test. Normal tactile sensation bilaterally. Nail Exam: Pt has thick disfigured discolored nails with subungual debris noted bilateral entire nail hallux  Ulcer Exam: There is no evidence of ulcer or pre-ulcerative changes or infection. Orthopedic Exam: Muscle tone and strength are WNL. No limitations in general ROM. No crepitus or effusions noted. Foot type and digits show no abnormalities.  HAV  B/L. Skin: No Porokeratosis. No infection or ulcers  Diagnosis:  Onychomycosis, , Pain in right toe, pain in left toes  Treatment & Plan Procedures and Treatment: Consent by patient was obtained for treatment procedures.   Debridement of mycotic and hypertrophic toenails, 1 through 5 bilateral and clearing of subungual debris. No ulceration, no infection noted.  Return Visit-Office Procedure: Patient instructed to return to the office for a follow up visit 3 months for continued evaluation and treatment.    Helane Gunther DPM

## 2018-03-08 ENCOUNTER — Ambulatory Visit (INDEPENDENT_AMBULATORY_CARE_PROVIDER_SITE_OTHER): Payer: Medicare Other | Admitting: Podiatry

## 2018-03-08 ENCOUNTER — Encounter: Payer: Self-pay | Admitting: Podiatry

## 2018-03-08 DIAGNOSIS — B351 Tinea unguium: Secondary | ICD-10-CM | POA: Diagnosis not present

## 2018-03-08 DIAGNOSIS — M79676 Pain in unspecified toe(s): Secondary | ICD-10-CM

## 2018-03-08 NOTE — Progress Notes (Signed)
Complaint:  Visit Type: Patient returns to my office for continued preventative foot care services. Complaint: Patient states" my nails have grown long and thick and become painful to walk and wear shoes" Patient has history of left hip surgery. The patient presents for preventative foot care services. No changes to ROS  Podiatric Exam: Vascular: dorsalis pedis and posterior tibial pulses are palpable bilateral. Capillary return is immediate. Temperature gradient is WNL. Skin turgor WNL  Sensorium: Normal Semmes Weinstein monofilament test. Normal tactile sensation bilaterally. Nail Exam: Pt has thick disfigured discolored nails with subungual debris noted bilateral entire nail hallux  Ulcer Exam: There is no evidence of ulcer or pre-ulcerative changes or infection. Orthopedic Exam: Muscle tone and strength are WNL. No limitations in general ROM. No crepitus or effusions noted. Foot type and digits show no abnormalities.  HAV  B/L. Skin: No Porokeratosis. No infection or ulcers  Diagnosis:  Onychomycosis, , Pain in right toe, pain in left toes  Treatment & Plan Procedures and Treatment: Consent by patient was obtained for treatment procedures.   Debridement of mycotic and hypertrophic toenails, 1 through 5 bilateral and clearing of subungual debris. No ulceration, no infection noted.  Return Visit-Office Procedure: Patient instructed to return to the office for a follow up visit 10 weeks  for continued evaluation and treatment.    Tuwana Kapaun DPM 

## 2018-05-17 ENCOUNTER — Ambulatory Visit: Payer: Medicare Other | Admitting: Podiatry

## 2018-06-10 ENCOUNTER — Encounter: Payer: Self-pay | Admitting: Podiatry

## 2018-06-10 ENCOUNTER — Ambulatory Visit (INDEPENDENT_AMBULATORY_CARE_PROVIDER_SITE_OTHER): Payer: Medicare Other | Admitting: Podiatry

## 2018-06-10 DIAGNOSIS — B351 Tinea unguium: Secondary | ICD-10-CM | POA: Diagnosis not present

## 2018-06-10 DIAGNOSIS — M79676 Pain in unspecified toe(s): Secondary | ICD-10-CM

## 2018-06-10 NOTE — Progress Notes (Signed)
Complaint:  Visit Type: Patient returns to my office for continued preventative foot care services. Complaint: Patient states" my nails have grown long and thick and become painful to walk and wear shoes" Patient has history of left hip surgery. The patient presents for preventative foot care services. No changes to ROS  Podiatric Exam: Vascular: dorsalis pedis and posterior tibial pulses are palpable bilateral. Capillary return is immediate. Temperature gradient is WNL. Skin turgor WNL  Sensorium: Normal Semmes Weinstein monofilament test. Normal tactile sensation bilaterally. Nail Exam: Pt has thick disfigured discolored nails with subungual debris noted bilateral entire nail hallux  Ulcer Exam: There is no evidence of ulcer or pre-ulcerative changes or infection. Orthopedic Exam: Muscle tone and strength are WNL. No limitations in general ROM. No crepitus or effusions noted. Foot type and digits show no abnormalities.  HAV  B/L. Skin: No Porokeratosis. No infection or ulcers  Diagnosis:  Onychomycosis, , Pain in right toe, pain in left toes  Treatment & Plan Procedures and Treatment: Consent by patient was obtained for treatment procedures.   Debridement of mycotic and hypertrophic toenails, 1 through 5 bilateral and clearing of subungual debris. No ulceration, no infection noted.  Return Visit-Office Procedure: Patient instructed to return to the office for a follow up visit 10 weeks  for continued evaluation and treatment.    Helane Gunther DPM

## 2018-06-21 ENCOUNTER — Emergency Department: Payer: Medicare Other

## 2018-06-21 ENCOUNTER — Emergency Department
Admission: EM | Admit: 2018-06-21 | Discharge: 2018-06-21 | Disposition: A | Discharge status: Home / Self Care | Payer: Medicare Other | Source: Home / Self Care | Attending: Emergency Medicine | Admitting: Emergency Medicine

## 2018-06-21 ENCOUNTER — Other Ambulatory Visit: Payer: Self-pay

## 2018-06-21 DIAGNOSIS — Y92017 Garden or yard in single-family (private) house as the place of occurrence of the external cause: Secondary | ICD-10-CM

## 2018-06-21 DIAGNOSIS — S42212A Unspecified displaced fracture of surgical neck of left humerus, initial encounter for closed fracture: Secondary | ICD-10-CM

## 2018-06-21 DIAGNOSIS — I252 Old myocardial infarction: Secondary | ICD-10-CM | POA: Insufficient documentation

## 2018-06-21 DIAGNOSIS — Y939 Activity, unspecified: Secondary | ICD-10-CM | POA: Insufficient documentation

## 2018-06-21 DIAGNOSIS — X58XXXA Exposure to other specified factors, initial encounter: Secondary | ICD-10-CM | POA: Insufficient documentation

## 2018-06-21 DIAGNOSIS — Z79899 Other long term (current) drug therapy: Secondary | ICD-10-CM | POA: Insufficient documentation

## 2018-06-21 DIAGNOSIS — Y999 Unspecified external cause status: Secondary | ICD-10-CM | POA: Insufficient documentation

## 2018-06-21 DIAGNOSIS — Z7982 Long term (current) use of aspirin: Secondary | ICD-10-CM

## 2018-06-21 DIAGNOSIS — I10 Essential (primary) hypertension: Secondary | ICD-10-CM

## 2018-06-21 DIAGNOSIS — A419 Sepsis, unspecified organism: Secondary | ICD-10-CM | POA: Diagnosis not present

## 2018-06-21 DIAGNOSIS — R509 Fever, unspecified: Secondary | ICD-10-CM | POA: Diagnosis not present

## 2018-06-21 MED ORDER — TRAMADOL HCL 50 MG PO TABS: 50.0000 mg | ORAL_TABLET | Freq: Two times a day (BID) | ORAL | 0 refills | Status: DC | PRN

## 2018-06-21 MED ORDER — ACETAMINOPHEN 500 MG PO TABS
1000.0000 mg | ORAL_TABLET | Freq: Once | ORAL | Status: AC
  Administered 2018-06-21: 1000 mg via ORAL
  Filled 2018-06-21: qty 2

## 2018-06-21 NOTE — ED Triage Notes (Signed)
Pt arrives via ems from home with reports of a fall. Ems states pt slipped and fell when putting clothes on a clothes line. Pt slid self down pole, denies LOC, or hitting head. No dizziness or lightheadedness. Pt c/o left shoulder pain that was hurting prior to fall, but states she has a hx of arthritis that feels similar. Pt A&O x 4 on arrival. NAD noted at this time

## 2018-06-21 NOTE — ED Provider Notes (Signed)
[facility] Emergency Department Provider Note ____________________________________________   None    (approximate)  I have reviewed the triage vital signs and the nursing notes.   HISTORY  Chief Complaint Fall and Shoulder Pain    HPI Leslie Huffman is a 85 y.o. female with PMH as noted below who presents with left shoulder pain after a fall, acute onset just prior to coming to the hospital.  The patient states that she was outside hanging clothes on a close line when she slipped on uneven ground.  The patient states that she then leaned against a pole and slid down to the ground.  She reports chronic left shoulder pain due to arthritis and states that since the fall the shoulder has been hurting more.  She denies hitting her head and has no other injuries or acute pain.  She states that she has otherwise been feeling well and was not feeling weak or lightheaded prior to the fall.  Past Medical History:  Diagnosis Date  . Heart attack (HCC)   . Hypertension   . Vertigo     There are no active problems to display for this patient.   Past Surgical History:  Procedure Laterality Date  . HIP SURGERY      Prior to Admission medications   Medication Sig Start Date End Date Taking? Authorizing Provider  acetaminophen (TYLENOL) 500 MG tablet Take 1,000 mg by mouth every 6 (six) hours as needed for moderate pain.    [provider]  aspirin EC 81 MG tablet Take 81 mg by mouth daily.     [provider]  calcium citrate-vitamin D 500-400 MG-UNIT chewable tablet Chew 1 tablet by mouth daily.    [provider]  levothyroxine (SYNTHROID, LEVOTHROID) 50 MCG tablet Take by mouth. 11/26/17   [provider]  lisinopril (PRINIVIL,ZESTRIL) 20 MG tablet Take 20 mg by mouth daily.  02/02/15   [provider]  metoprolol succinate (TOPROL-XL) 50 MG 24 hr tablet Take 50 mg by mouth daily.     [provider]  Multiple  Vitamins-Minerals (PRESERVISION AREDS) TABS Take 1 tablet by mouth daily.    [provider]  traMADol (ULTRAM) 50 MG tablet Take 1 tablet (50 mg total) by mouth every 12 (twelve) hours as needed for up to 7 days for severe pain. 06/21/18 06/28/18  Dionne Bucy, MD  triamterene-hydrochlorothiazide (MAXZIDE-25) 37.5-25 MG tablet Take 1 tablet by mouth daily. 02/02/15   [provider]    Allergies Sulfa antibiotics  History reviewed. No pertinent family history.  Social History Social History   Tobacco Use  . Smoking status: Never Smoker  . Smokeless tobacco: Never Used  Substance Use Topics  . Alcohol use: No  . Drug use: No    Review of Systems  Constitutional: No fever. Eyes: No redness. ENT: No neck pain. Cardiovascular: Denies chest pain. Respiratory: Denies shortness of breath. Gastrointestinal: No vomiting or diarrhea.  Genitourinary: Negative for flank pain.  Musculoskeletal: Negative for back pain.  Positive for left shoulder pain. Skin: Negative for abrasions or lacerations. Neurological: Negative for headache.   ____________________________________________   PHYSICAL EXAM:  VITAL SIGNS: ED Triage Vitals [06/21/18 1537]  Enc Vitals Group     BP      Pulse      Resp      Temp      Temp src      SpO2 95 %     Weight  Height      Head Circumference      Peak Flow      Pain Score      Pain Loc      Pain Edu?      Excl. in GC?     Constitutional: Alert and oriented. Well appearing for age and in no acute distress. Eyes: Conjunctivae are normal.  Head: Atraumatic. Nose: No congestion/rhinnorhea. Mouth/Throat: Mucous membranes are moist.   Neck: Normal range of motion.  No midline cervical spinal tenderness. Cardiovascular: Good peripheral circulation. Respiratory: Normal respiratory effort.  Gastrointestinal: Soft and nontender. No distention.  Genitourinary: No flank tenderness. Musculoskeletal: Pain on range of motion  of left shoulder.  No deformity.  Full range of motion with no significant pain to right shoulder, bilateral elbows and wrists, and bilateral hips, knees, and ankles. Neurologic:  Normal speech and language.  Motor intact in all extremities.  Normal coordination.  No gross focal neurologic deficits are appreciated.  Skin:  Skin is warm and dry. No rash noted. Psychiatric: Mood and affect are normal. Speech and behavior are normal.  ____________________________________________   LABS (all labs ordered are listed, but only abnormal results are displayed)  Labs Reviewed - No data to display ____________________________________________  EKG  ED ECG REPORT I, Dionne Bucy, the attending physician, personally viewed and interpreted this ECG.  Date: 06/21/2018 EKG Time: 1535 Rate: 77 Rhythm: normal sinus rhythm QRS Axis: normal Intervals: LBBB ST/T Wave abnormalities: normal Narrative Interpretation: no evidence of acute ischemia; no significant change when compared to EKG of 11/24/2017  ____________________________________________  RADIOLOGY  XR L shoulder: Slightly displaced and comminuted proximal humerus fracture at the surgical neck  ____________________________________________   PROCEDURES  Procedure(s) performed: No  Procedures  Critical Care performed: No ____________________________________________   INITIAL IMPRESSION / ASSESSMENT AND PLAN / ED COURSE  Pertinent labs & imaging results that were available during my care of the patient were reviewed by me and considered in my medical decision making (see chart for details).  85 year old female with PMH as noted above presents with left shoulder pain after a fall during which she let herself down a pole.  The patient states she fell due to uneven ground, and denies any weakness or lightheadedness.  Review of systems is otherwise negative.  On exam the patient is well-appearing for her age.  She has pain on  range of motion of the left shoulder, and no other significant exam findings.  The patient reports chronic left shoulder pain which she believes is exacerbated today by this fall.  I have a low suspicion for a fracture but given the patient's age we will obtain an x-ray.  There is no indication for medical work-up at this time.  I anticipate discharge home if the x-ray is negative.  ----------------------------------------- 5:17 PM on 06/21/2018 -----------------------------------------  X-ray shows surgical neck fracture of the left humerus.  I consulted Dr. Hyacinth Meeker from orthopedics who reviewed the images.  He recommends nonoperative management at this time, with shoulder immobilizer and follow-up with him in 5 days.  I discussed this with the patient and her family members who are now here with her.  She feels comfortable going home and her family will be able to assist her with her ADLs.  Her pain is relatively well controlled here with Tylenol but based on discussion with the patient and her family members I will prescribe tramadol as needed for breakthrough pain especially at night.  Return precautions given, the patient expresses  understanding. ____________________________________________   FINAL CLINICAL IMPRESSION(S) / ED DIAGNOSES  Final diagnoses:  Closed displaced fracture of surgical neck of left humerus, unspecified fracture morphology, initial encounter      NEW MEDICATIONS STARTED DURING THIS VISIT:  New Prescriptions   TRAMADOL (ULTRAM) 50 MG TABLET    Take 1 tablet (50 mg total) by mouth every 12 (twelve) hours as needed for up to 7 days for severe pain.     Note:  This document was prepared using Dragon voice recognition software and may include unintentional dictation errors.    Dionne Bucy, MD 06/21/18 1718

## 2018-06-21 NOTE — ED Notes (Signed)
Pt denies any lightheadedness, SOB, dizziness, N/V/D

## 2018-06-21 NOTE — Discharge Instructions (Signed)
Wear the shoulder immobilizer until you follow-up.  You should apply ice to the area as much as possible.  You should continue to take Tylenol for pain during the day, but can take the tramadol at night or when needed for more severe pain.  Follow-up with Dr. Hyacinth Meeker in 5 days.  Return to the ER for new, worsening, persistent severe pain, weakness or numbness, or any other new or worsening symptoms that concern you.

## 2018-06-22 ENCOUNTER — Emergency Department
Admission: EM | Admit: 2018-06-22 | Discharge: 2018-06-24 | Disposition: A | Discharge status: Skilled Nursing Facility | Payer: Medicare Other | Source: Home / Self Care | Attending: Emergency Medicine | Admitting: Emergency Medicine

## 2018-06-22 ENCOUNTER — Other Ambulatory Visit: Payer: Self-pay

## 2018-06-22 ENCOUNTER — Encounter: Payer: Self-pay | Admitting: Emergency Medicine

## 2018-06-22 DIAGNOSIS — I10 Essential (primary) hypertension: Secondary | ICD-10-CM | POA: Insufficient documentation

## 2018-06-22 DIAGNOSIS — I252 Old myocardial infarction: Secondary | ICD-10-CM | POA: Insufficient documentation

## 2018-06-22 DIAGNOSIS — R112 Nausea with vomiting, unspecified: Secondary | ICD-10-CM

## 2018-06-22 DIAGNOSIS — M79602 Pain in left arm: Secondary | ICD-10-CM

## 2018-06-22 LAB — CBC WITH DIFFERENTIAL/PLATELET
Abs Immature Granulocytes: 0.06 10*3/uL (ref 0.00–0.07)
Basophils Absolute: 0 10*3/uL (ref 0.0–0.1)
Basophils Relative: 0 %
EOS PCT: 0 %
Eosinophils Absolute: 0 10*3/uL (ref 0.0–0.5)
HEMATOCRIT: 31.5 % — AB (ref 36.0–46.0)
HEMOGLOBIN: 10.6 g/dL — AB (ref 12.0–15.0)
Immature Granulocytes: 1 %
LYMPHS ABS: 1 10*3/uL (ref 0.7–4.0)
LYMPHS PCT: 12 %
MCH: 32.8 pg (ref 26.0–34.0)
MCHC: 33.7 g/dL (ref 30.0–36.0)
MCV: 97.5 fL (ref 80.0–100.0)
MONOS PCT: 8 %
Monocytes Absolute: 0.6 10*3/uL (ref 0.1–1.0)
Neutro Abs: 6.7 10*3/uL (ref 1.7–7.7)
Neutrophils Relative %: 79 %
Platelets: 226 10*3/uL (ref 150–400)
RBC: 3.23 MIL/uL — ABNORMAL LOW (ref 3.87–5.11)
RDW: 14.4 % (ref 11.5–15.5)
WBC: 8.5 10*3/uL (ref 4.0–10.5)
nRBC: 0 % (ref 0.0–0.2)

## 2018-06-22 LAB — URINALYSIS, COMPLETE (UACMP) WITH MICROSCOPIC
BACTERIA UA: NONE SEEN
Bilirubin Urine: NEGATIVE
Glucose, UA: NEGATIVE mg/dL
KETONES UR: NEGATIVE mg/dL
NITRITE: NEGATIVE
PH: 6 (ref 5.0–8.0)
Protein, ur: NEGATIVE mg/dL
SQUAMOUS EPITHELIAL / LPF: NONE SEEN (ref 0–5)
Specific Gravity, Urine: 1.011 (ref 1.005–1.030)

## 2018-06-22 LAB — COMPREHENSIVE METABOLIC PANEL
ALBUMIN: 3.7 g/dL (ref 3.5–5.0)
ALT: 21 U/L (ref 0–44)
AST: 28 U/L (ref 15–41)
Alkaline Phosphatase: 59 U/L (ref 38–126)
Anion gap: 9 (ref 5–15)
BILIRUBIN TOTAL: 0.9 mg/dL (ref 0.3–1.2)
BUN: 30 mg/dL — AB (ref 8–23)
CHLORIDE: 98 mmol/L (ref 98–111)
CO2: 27 mmol/L (ref 22–32)
Calcium: 8.9 mg/dL (ref 8.9–10.3)
Creatinine, Ser: 1.1 mg/dL — ABNORMAL HIGH (ref 0.44–1.00)
GFR calc Af Amer: 51 mL/min — ABNORMAL LOW (ref >60)
GFR calc non Af Amer: 44 mL/min — ABNORMAL LOW (ref >60)
GLUCOSE: 150 mg/dL — AB (ref 70–99)
POTASSIUM: 4.2 mmol/L (ref 3.5–5.1)
Sodium: 134 mmol/L — ABNORMAL LOW (ref 135–145)
TOTAL PROTEIN: 6.4 g/dL — AB (ref 6.5–8.1)

## 2018-06-22 MED ORDER — LEVOTHYROXINE SODIUM 50 MCG PO TABS
50.0000 ug | ORAL_TABLET | Freq: Every day | ORAL | Status: DC
  Administered 2018-06-23: 50 ug via ORAL
  Filled 2018-06-22 (×2): qty 1

## 2018-06-22 MED ORDER — TRAMADOL HCL 50 MG PO TABS
50.0000 mg | ORAL_TABLET | Freq: Two times a day (BID) | ORAL | Status: DC | PRN
  Filled 2018-06-22: qty 1

## 2018-06-22 MED ORDER — ONDANSETRON HCL 4 MG/2ML IJ SOLN
4.0000 mg | Freq: Once | INTRAMUSCULAR | Status: AC
  Administered 2018-06-22: 4 mg via INTRAVENOUS
  Filled 2018-06-22: qty 2

## 2018-06-22 MED ORDER — TRIAMTERENE-HCTZ 37.5-25 MG PO TABS
1.0000 | ORAL_TABLET | Freq: Every day | ORAL | Status: DC
  Administered 2018-06-24: 1 via ORAL
  Filled 2018-06-22 (×2): qty 1

## 2018-06-22 MED ORDER — OXYCODONE-ACETAMINOPHEN 5-325 MG PO TABS
1.0000 | ORAL_TABLET | ORAL | Status: DC | PRN
  Administered 2018-06-22 – 2018-06-23 (×2): 1 via ORAL
  Administered 2018-06-23: 2 via ORAL
  Administered 2018-06-23: 1 via ORAL
  Filled 2018-06-22: qty 2
  Filled 2018-06-22 (×3): qty 1

## 2018-06-22 MED ORDER — ONDANSETRON 4 MG PO TBDP
4.0000 mg | ORAL_TABLET | Freq: Three times a day (TID) | ORAL | Status: DC | PRN
  Administered 2018-06-22: 4 mg via ORAL
  Filled 2018-06-22: qty 1

## 2018-06-22 MED ORDER — METOPROLOL SUCCINATE ER 25 MG PO TB24
50.0000 mg | ORAL_TABLET | Freq: Every day | ORAL | Status: DC
  Administered 2018-06-24: 50 mg via ORAL
  Filled 2018-06-22: qty 1

## 2018-06-22 MED ORDER — LISINOPRIL 10 MG PO TABS
20.0000 mg | ORAL_TABLET | Freq: Every day | ORAL | Status: DC
  Administered 2018-06-24: 20 mg via ORAL
  Filled 2018-06-22: qty 2

## 2018-06-22 MED ORDER — SODIUM CHLORIDE 0.9 % IV BOLUS
500.0000 mL | Freq: Once | INTRAVENOUS | Status: AC
  Administered 2018-06-22: 500 mL via INTRAVENOUS

## 2018-06-22 MED ORDER — MORPHINE SULFATE (PF) 2 MG/ML IV SOLN
2.0000 mg | Freq: Once | INTRAVENOUS | Status: AC
  Administered 2018-06-22: 2 mg via INTRAVENOUS
  Filled 2018-06-22: qty 1

## 2018-06-22 NOTE — ED Notes (Signed)
PT currently working with pt.  

## 2018-06-22 NOTE — ED Notes (Signed)
Pt repositioned at this time, soiled brief changed, suction catheter placed back on pt at this time

## 2018-06-22 NOTE — ED Provider Notes (Signed)
[facility] Emergency Department Provider Note       Time seen: ----------------------------------------- 8:39 AM on 06/22/2018 -----------------------------------------   I have reviewed the triage vital signs and the nursing notes.  HISTORY   Chief Complaint Arm Pain    HPI Leslie Huffman is a 85 y.o. female with a history of MI, hypertension, vertigo who presents to the ED for increased pain since being discharged yesterday after being diagnosed with a proximal humerus fracture.  She has had persistent vomiting since yesterday from unresolved pain.  She denies fevers, chills or other complaints.  Past Medical History:  Diagnosis Date  . Heart attack (HCC)   . Hypertension   . Vertigo     There are no active problems to display for this patient.   Past Surgical History:  Procedure Laterality Date  . HIP SURGERY      Allergies Sulfa antibiotics  Social History Social History   Tobacco Use  . Smoking status: Never Smoker  . Smokeless tobacco: Never Used  Substance Use Topics  . Alcohol use: No  . Drug use: No   Review of Systems Constitutional: Negative for fever. Cardiovascular: Negative for chest pain. Respiratory: Negative for shortness of breath. Gastrointestinal: Negative for abdominal pain, positive for vomiting Musculoskeletal: Positive for left arm pain Skin: Negative for rash. Neurological: Negative for headaches, focal weakness or numbness.  All systems negative/normal/unremarkable except as stated in the HPI  ____________________________________________   PHYSICAL EXAM:  VITAL SIGNS: ED Triage Vitals  Enc Vitals Group     BP 06/22/18 0817 (!) 159/75     Pulse Rate 06/22/18 0817 69     Resp 06/22/18 0817 16     Temp 06/22/18 0817 98.8 F (37.1 C)     Temp Source 06/22/18 0817 Oral     SpO2 06/22/18 0811 92 %     Weight 06/22/18 0819 130 lb (59 kg)     Height 06/22/18 0819 5\' 2"  (1.575 m)     Head  Circumference --      Peak Flow --      Pain Score 06/22/18 0818 Asleep     Pain Loc --      Pain Edu? --      Excl. in GC? --     Constitutional: Alert and oriented.  No acute distress Eyes: Conjunctivae are normal. Normal extraocular movements. Cardiovascular: Normal rate, regular rhythm. No murmurs, rubs, or gallops. Respiratory: Normal respiratory effort without tachypnea nor retractions. Breath sounds are clear and equal bilaterally. No wheezes/rales/rhonchi. Gastrointestinal: Soft and nontender. Normal bowel sounds Musculoskeletal: Proximal left arm tenderness and swelling is noted Neurologic:  Normal speech and language. No gross focal neurologic deficits are appreciated.  Skin:  Skin is warm, dry and intact. No rash noted. Psychiatric: Mood and affect are normal. Speech and behavior are normal.  ____________________________________________  ED COURSE:  As part of my medical decision making, I reviewed the following data within the electronic MEDICAL RECORD NUMBER History obtained from family if available, nursing notes, old chart and ekg, as well as notes from prior ED visits. Patient presented for persistent left arm pain and vomiting, we will assess with labs and imaging as indicated at this time.   Procedures ____________________________________________   LABS (pertinent positives/negatives)  Labs Reviewed  CBC WITH DIFFERENTIAL/PLATELET - Abnormal; Notable for the following components:      Result Value   RBC 3.23 (*)    Hemoglobin 10.6 (*)    HCT 31.5 (*)  All other components within normal limits  COMPREHENSIVE METABOLIC PANEL - Abnormal; Notable for the following components:   Sodium 134 (*)    Glucose, Bld 150 (*)    BUN 30 (*)    Creatinine, Ser 1.10 (*)    Total Protein 6.4 (*)    GFR calc non Af Amer 44 (*)    GFR calc Af Amer 51 (*)    All other components within normal limits  URINALYSIS, COMPLETE (UACMP) WITH MICROSCOPIC    ____________________________________________   DIFFERENTIAL DIAGNOSIS   Dehydration, electrolyte abnormality, medication side effect  FINAL ASSESSMENT AND PLAN  Vomiting, left arm pain   Plan: The patient had presented for vomiting left arm pain secondary to recent fracture. Patient's labs did not reveal any acute process. Patient's imaging was reviewed from yesterday.  Family states they cannot care for her.  I will discuss with social work for possible skilled nursing facility placement   Ulice Dash, MD    Note: This note was generated in part or whole with voice recognition software. Voice recognition is usually quite accurate but there are transcription errors that can and very often do occur. I apologize for any typographical errors that were not detected and corrected.     Emily Filbert, MD 06/22/18 1006

## 2018-06-22 NOTE — ED Notes (Signed)
Pt moved onto a regular hospital bed.

## 2018-06-22 NOTE — ED Notes (Signed)
Pt cleared by ed MD to eat and drink. Pt given sandwich tray and apple sauce, water and juice to drink by this RN

## 2018-06-22 NOTE — ED Triage Notes (Signed)
Patient arrives via EMS post discharge 3/9 from [facility]. Patient suffered humeral fracture 3/9, complains of increased pain since discharge yesterday. Patient reports vomiting from unresolved pain.

## 2018-06-22 NOTE — TOC Initial Note (Signed)
Transition of Care [facility]) - Initial/Assessment Note    Patient Details  Name: Leslie Huffman MRN: [id number] Date of Birth: 05-30-1924  Transition of Care [facility]) CM/SW Contact:    Allayne Butcher, RN Phone Number: 06/22/2018, 2:41 PM  Clinical Narrative:                 Patient was seen yesterday in the ED after a fall that resulted in humerus fracture.  Patient was brought back today for extreme pain and nausea and vomiting related to pain.  Patient has been medically cleared for discharge.  Patient's family is at the bedside and reports that they are unable to care for her at home and she cannot care for herself.  The patient reports that she thinks it is time for her to go to a nursing home.  RNCM will attempt to find placement.  Expected Discharge Plan: Skilled Nursing Facility     Patient Goals and CMS Choice Patient states their goals for this hospitalization and ongoing recovery are:: Patient reports that she can no longer take care of herself and wants to find placement in a facility   Choice offered to / list presented to : Patient  Expected Discharge Plan and Services Expected Discharge Plan: Skilled Nursing Facility Discharge Planning Services: CM Consult   Living arrangements for the past 2 months: Apartment                          Prior Living Arrangements/Services Living arrangements for the past 2 months: Apartment Lives with:: Self   Do you feel safe going back to the place where you live?: No      Need for Family Participation in Patient Care: Yes (Comment) Care giver support system in place?: Yes (comment)      Activities of Daily Living      Permission Sought/Granted Permission sought to share information with : Case Manager, Facility Medical sales representative, PCP, Family Supports Permission granted to share information with : Yes, Verbal Permission Granted     Permission granted to share info w AGENCY: Any facility for placement  Permission granted  to share info w Relationship: any healthcare workers and her family     Emotional Assessment Appearance:: Appears stated age, Well-Groomed Attitude/Demeanor/Rapport: Other (comment)(patient in pain and moaning) Affect (typically observed): Calm, Quiet Orientation: : Oriented to Self, Oriented to Place, Oriented to  Time, Oriented to Situation Alcohol / Substance Use: Not Applicable Psych Involvement: No (comment)  Admission diagnosis:  pain There are no active problems to display for this patient.  PCP:  Dorothey Baseman, MD Pharmacy:   [facility] [place], [place] - [address] [place] [address] Phone: [phone] Fax: [phone]     Social Determinants of Health (SDOH) Interventions    Readmission Risk Interventions  No flowsheet data found.

## 2018-06-22 NOTE — NC FL2 (Signed)
  [place] MEDICAID FL2 LEVEL OF CARE SCREENING TOOL     IDENTIFICATION  Patient Name: Leslie Huffman Birthdate: 1924-11-01 Sex: female Admission Date (Current Location): 06/22/2018  [place] and [place] Number:  [address] [id number] Facility and Address:  [facility], [address] [place], [place] [address]      Provider Number: [id number]  Attending Physician Name and Address:  Emily Filbert, MD  Relative Name and Phone Number:       Current Level of Care: Home Recommended Level of Care: Skilled Nursing Facility Prior Approval Number:    Date Approved/Denied:   PASRR Number: [id number]  Discharge Plan: SNF    Current Diagnoses: There are no active problems to display for this patient.   Orientation RESPIRATION BLADDER Height & Weight     Self, Time, Situation, Place  Normal Continent Weight: 59 kg Height:  5\' 2"  (157.5 cm)  BEHAVIORAL SYMPTOMS/MOOD NEUROLOGICAL BOWEL NUTRITION STATUS      Continent Diet  AMBULATORY STATUS COMMUNICATION OF NEEDS Skin   Extensive Assist Verbally Normal                       Personal Care Assistance Level of Assistance  Bathing, Dressing Bathing Assistance: Maximum assistance   Dressing Assistance: Maximum assistance     Functional Limitations Info             SPECIAL CARE FACTORS FREQUENCY  PT (By licensed PT), OT (By licensed OT)                    Contractures Contractures Info: Not present    Additional Factors Info  Allergies, Code Status Code Status Info: Full Allergies Info: Sulfa Antibiotics           Current Medications (06/22/2018):  This is the current hospital active medication list No current facility-administered medications for this encounter.    Current Outpatient Medications  Medication Sig Dispense Refill  . acetaminophen (TYLENOL) 500 MG tablet Take 1,000 mg by mouth every 6 (six) hours as needed for moderate pain.    Marland Kitchen aspirin EC 81 MG tablet  Take 81 mg by mouth daily.     . calcium citrate-vitamin D 500-400 MG-UNIT chewable tablet Chew 1 tablet by mouth daily.    Marland Kitchen levothyroxine (SYNTHROID, LEVOTHROID) 50 MCG tablet Take by mouth.    Marland Kitchen lisinopril (PRINIVIL,ZESTRIL) 20 MG tablet Take 20 mg by mouth daily.     . metoprolol succinate (TOPROL-XL) 50 MG 24 hr tablet Take 50 mg by mouth daily.     . Multiple Vitamins-Minerals (PRESERVISION AREDS) TABS Take 1 tablet by mouth daily.    . traMADol (ULTRAM) 50 MG tablet Take 1 tablet (50 mg total) by mouth every 12 (twelve) hours as needed for up to 7 days for severe pain. 14 tablet 0  . triamterene-hydrochlorothiazide (MAXZIDE-25) 37.5-25 MG tablet Take 1 tablet by mouth daily.       Discharge Medications: Please see discharge summary for a list of discharge medications.  Relevant Imaging Results:  Relevant Lab Results:   Additional Information    Allayne Butcher, RN

## 2018-06-22 NOTE — Evaluation (Signed)
Physical Therapy Evaluation Patient Details Name: Leslie Huffman MRN: [id number] DOB: 1925-04-13 Today's Date: 06/22/2018   History of Present Illness  From MD H&P: Pt is a 85 y.o. female with PMH that includes HTN, MI, and vertigo who presented to ED with left shoulder pain after a fall.  The patient stated that she was outside hanging clothes on a close line when she slipped on uneven ground.  The patient stated that she then leaned against a pole and slid down to the ground.  Pt diagnosed with surgical neck fracture of the L humerus with nonoperative management recommended by orthopedic MD.  Pt dicharged home from the ED 06/21/18 but returned to ED 06/22/18 with increased LUE pain and vomiting.        Clinical Impression  Pt presents with deficits in strength, transfers, mobility, gait, balance, and activity tolerance.  Pt required extensive +2 assistance with bed mobility tasks and sit to/from stand transfers.  Pt was able to take several very small steps at the EOB with HHA and +2 Mod A for stability.  Pt was unable to maintain static standing balance independently with posterior LOB noted each time assistance was reduced.  Pt is at an extremely high risk for falls and presents with a significant increase in functional mobility compared to her baseline and is unsafe to return to her prior living situation at this time.  Pt will benefit from PT services in a SNF setting upon discharge to safely address above deficits for decreased caregiver assistance and eventual return to PLOF.      Follow Up Recommendations SNF;Supervision for mobility/OOB    Equipment Recommendations  Other (comment)(TBD at next venue of care)    Recommendations for Other Services       Precautions / Restrictions Precautions Precautions: Fall Required Braces or Orthoses: Sling Restrictions Weight Bearing Restrictions: Yes LUE Weight Bearing: Non weight bearing(No WB status noted in chart but treated as NWB during the  session)      Mobility  Bed Mobility Overal bed mobility: Needs Assistance Bed Mobility: Supine to Sit;Sit to Supine     Supine to sit: +2 for physical assistance;Max assist Sit to supine: +2 for physical assistance;Max assist   General bed mobility comments: Near total assist for bed mobility tasks required  Transfers Overall transfer level: Needs assistance Equipment used: 1 person hand held assist Transfers: Sit to/from Stand Sit to Stand: +2 physical assistance         General transfer comment: Heavy verbal and tactile cues for sequencing with pt requiring +2 assist to stand and for stabiltiy and safety while in standing  Ambulation/Gait Ambulation/Gait assistance: Mod assist;+2 physical assistance Gait Distance (Feet): 1 Feet Assistive device: 1 person hand held assist Gait Pattern/deviations: Step-to pattern;Shuffle Gait velocity: decreased   General Gait Details: Pt able to take several very small steps at the EOB with constant assist required to prevent LOB.  Stairs            Wheelchair Mobility    Modified Rankin (Stroke Patients Only)       Balance Overall balance assessment: Needs assistance Sitting-balance support: Single extremity supported Sitting balance-Leahy Scale: Poor Sitting balance - Comments: Frequent min A to prevent posterior LOB Postural control: Posterior lean Standing balance support: Single extremity supported Standing balance-Leahy Scale: Poor Standing balance comment: Constant assist required to prevent LOB  Pertinent Vitals/Pain Pain Assessment: Faces Faces Pain Scale: Hurts whole lot Pain Descriptors / Indicators: Moaning;Guarding;Grimacing Pain Intervention(s): Limited activity within patient's tolerance;Premedicated before session;Monitored during session    Home Living Family/patient expects to be discharged to:: Private residence Living Arrangements: Alone Available Help at  Discharge: Family;Available PRN/intermittently Type of Home: Independent living facility(Apt at a senior living facility) Home Access: Level entry     Home Layout: One level Home Equipment: Walker - 4 wheels      Prior Function Level of Independence: Independent with assistive device(s)         Comments: Mod Ind amb with a rollator, no other falls in the last 6 months, Ind with bathing and dressing and simple meal prep, facility prepares lunch     Hand Dominance        Extremity/Trunk Assessment   Upper Extremity Assessment Upper Extremity Assessment: Generalized weakness;LUE deficits/detail LUE: Unable to fully assess due to pain;Unable to fully assess due to immobilization    Lower Extremity Assessment Lower Extremity Assessment: Generalized weakness       Communication   Communication: No difficulties  Cognition Arousal/Alertness: Lethargic Behavior During Therapy: Flat affect Overall Cognitive Status: Within Functional Limits for tasks assessed                                        General Comments      Exercises Total Joint Exercises Long Arc Quad: AROM;Both;10 reps;15 reps Knee Flexion: AROM;Both;10 reps;15 reps Other Exercises Other Exercises: Static sitting at EOB for sitting balance and trunk control x 8 min with anterior weight shifting cues   Assessment/Plan    PT Assessment Patient needs continued PT services  PT Problem List Decreased strength;Decreased activity tolerance;Decreased balance;Decreased mobility;Decreased knowledge of use of DME       PT Treatment Interventions DME instruction;Gait training;Functional mobility training;Therapeutic activities;Therapeutic exercise;Balance training;Patient/family education    PT Goals (Current goals can be found in the Care Plan section)  Acute Rehab PT Goals Patient Stated Goal: Increased functional mobility with decreased pain PT Goal Formulation: With patient/family Time For  Goal Achievement: 07/05/18 Potential to Achieve Goals: Fair    Frequency 7X/week   Barriers to discharge Inaccessible home environment;Decreased caregiver support      Co-evaluation               AM-PAC PT "6 Clicks" Mobility  Outcome Measure Help needed turning from your back to your side while in a flat bed without using bedrails?: Total Help needed moving from lying on your back to sitting on the side of a flat bed without using bedrails?: Total Help needed moving to and from a bed to a chair (including a wheelchair)?: A Lot Help needed standing up from a chair using your arms (e.g., wheelchair or bedside chair)?: A Lot Help needed to walk in hospital room?: Total Help needed climbing 3-5 steps with a railing? : Total 6 Click Score: 8    End of Session Equipment Utilized During Treatment: Gait belt;Oxygen Activity Tolerance: Patient tolerated treatment well;No increased pain Patient left: in bed;with call bell/phone within reach;with family/visitor present Nurse Communication: Mobility status PT Visit Diagnosis: Unsteadiness on feet (R26.81);Muscle weakness (generalized) (M62.81);Difficulty in walking, not elsewhere classified (R26.2)    Time: 1340-1408 PT Time Calculation (min) (ACUTE ONLY): 28 min   Charges:   PT Evaluation $PT Eval Moderate Complexity: 1 Mod PT Treatments $Therapeutic Exercise:  8-22 mins        D. Elly Modena PT, DPT 06/22/18, 2:51 PM

## 2018-06-23 MED ORDER — BISACODYL 5 MG PO TBEC
5.0000 mg | DELAYED_RELEASE_TABLET | Freq: Once | ORAL | Status: AC
  Administered 2018-06-23: 5 mg via ORAL
  Filled 2018-06-23: qty 1

## 2018-06-23 NOTE — Progress Notes (Signed)
Physical Therapy Treatment Patient Details Name: Leslie Huffman MRN: [id number] DOB: [birth year] Today's Date: 06/23/2018    History of Present Illness From MD H&P: Pt is a 85 y.o. female with PMH that includes HTN, MI, and vertigo who presented to ED with left shoulder pain after a fall.  The patient stated that she was outside hanging clothes on a close line when she slipped on uneven ground.  The patient stated that she then leaned against a pole and slid down to the ground.  Pt diagnosed with surgical neck fracture of the L humerus with nonoperative management recommended by orthopedic MD.  Pt dicharged home from the ED 06/21/18 but returned to ED 06/22/18 with increased LUE pain and vomiting.        PT Comments    Pt presents with deficits in strength, transfers, mobility, gait, balance, and activity tolerance but did make some progress towards goals this session.  Pt continued to require +2 extensive assistance with bed mobility tasks but participated with increased effort this session.  Pt required frequent min A in sitting to prevent posterior LOB but was able to maintain static sitting balance for brief periods.  Pt was able to stand with min A and then amb x 6 feet with HHA and mod A for stability.  Pt will benefit from PT services in a SNF setting upon discharge to safely address above deficits for decreased caregiver assistance and eventual return to PLOF.      Follow Up Recommendations  SNF;Supervision for mobility/OOB     Equipment Recommendations  Other (comment)(TBD at next venue of care)    Recommendations for Other Services       Precautions / Restrictions Precautions Precautions: Fall Required Braces or Orthoses: Sling Restrictions Weight Bearing Restrictions: Yes LUE Weight Bearing: Non weight bearing(No WB status noted during chart review with NWB status assumed during the session)    Mobility  Bed Mobility Overal bed mobility: Needs Assistance Bed Mobility: Supine  to Sit;Sit to Supine     Supine to sit: +2 for physical assistance;Max assist Sit to supine: +2 for physical assistance;Max assist   General bed mobility comments: Near total assist for bed mobility tasks required  Transfers Overall transfer level: Needs assistance Equipment used: 1 person hand held assist Transfers: Sit to/from Stand Sit to Stand: +2 safety/equipment;Min assist         General transfer comment: Mod verbal cues for sequencing most notably for foot placement and increased anterior weight shifting  Ambulation/Gait Ambulation/Gait assistance: Mod assist;+2 safety/equipment Gait Distance (Feet): 6 Feet Assistive device: 1 person hand held assist Gait Pattern/deviations: Step-to pattern;Shuffle Gait velocity: decreased   General Gait Details: Pt able to take very small steps at the EOB with constant assist required to prevent LOB.   Stairs             Wheelchair Mobility    Modified Rankin (Stroke Patients Only)       Balance Overall balance assessment: Needs assistance Sitting-balance support: Single extremity supported Sitting balance-Leahy Scale: Poor Sitting balance - Comments: Frequent min A to prevent posterior LOB Postural control: Posterior lean Standing balance support: Single extremity supported Standing balance-Leahy Scale: Poor Standing balance comment: Constant assist required to prevent LOB                            Cognition Arousal/Alertness: Awake/alert Behavior During Therapy: Flat affect Overall Cognitive Status: Within Functional Limits for tasks assessed  Exercises Total Joint Exercises Ankle Circles/Pumps: AROM;Both;10 reps Quad Sets: Strengthening;Both;10 reps Gluteal Sets: Strengthening;Both;10 reps Heel Slides: AAROM;Both;5 reps Hip ABduction/ADduction: AAROM;Both;10 reps Straight Leg Raises: AAROM;Both;10 reps Long Arc Quad: AROM;Both;10  reps Knee Flexion: AROM;Both;10 reps Other Exercises Other Exercises: Anterior weight shifting activities in sitting and standing to address posterior instability     General Comments        Pertinent Vitals/Pain Pain Assessment: Faces Faces Pain Scale: Hurts a little bit Pain Location: LUE Pain Descriptors / Indicators: Other (Comment)(Occasional moaning/grimacing with pt unable to rate pain verbally) Pain Intervention(s): Premedicated before session;Monitored during session    Home Living                      Prior Function            PT Goals (current goals can now be found in the care plan section) Progress towards PT goals: Progressing toward goals    Frequency    7X/week      PT Plan Current plan remains appropriate    Co-evaluation              AM-PAC PT "6 Clicks" Mobility   Outcome Measure  Help needed turning from your back to your side while in a flat bed without using bedrails?: Total Help needed moving from lying on your back to sitting on the side of a flat bed without using bedrails?: Total Help needed moving to and from a bed to a chair (including a wheelchair)?: A Lot Help needed standing up from a chair using your arms (e.g., wheelchair or bedside chair)?: A Lot Help needed to walk in hospital room?: Total Help needed climbing 3-5 steps with a railing? : Total 6 Click Score: 8    End of Session Equipment Utilized During Treatment: Gait belt;Oxygen Activity Tolerance: Patient tolerated treatment well;No increased pain Patient left: in bed;with call bell/phone within reach;with family/visitor present;with bed alarm set Nurse Communication: Mobility status;Other (comment)(Pt found on room air upon entering room with SpO2 87%, increased quickly to 94% on 2LO2/min) PT Visit Diagnosis: Unsteadiness on feet (R26.81);Muscle weakness (generalized) (M62.81);Difficulty in walking, not elsewhere classified (R26.2)     Time: 2111-5520 PT  Time Calculation (min) (ACUTE ONLY): 26 min  Charges:  $Therapeutic Exercise: 8-22 mins $Therapeutic Activity: 8-22 mins                     D. Scott Chiquitta Matty PT, DPT 06/23/18, 2:56 PM

## 2018-06-23 NOTE — ED Notes (Signed)
Pt cleaned up. Placed fresh pure wick and positioned pt to her right side.  Pt and family given toiletries.  Pt had a small stool smear.  LBM was on Monday.

## 2018-06-23 NOTE — TOC Progression Note (Signed)
Transition of Care [facility]) - Progression Note    Patient Details  Name: Leslie Huffman MRN: [id number] Date of Birth: April 08, 1925  Transition of Care [facility]) CM/SW Contact  Allayne Butcher, RN Phone Number: 06/23/2018, 2:48 PM  Clinical Narrative:     Offers received for 2 facilities, [facility] and [facility].  Both of these options presented to patient and patient's family.  Family is considering their options and would like to think about it.  Family and patient made aware that Medicare part B will cover rehab but not the room and board.  Medicaid will not pay until after 30 days so the family may need to provide some payment up front.  Patient's son verbalizes understanding.  RNCM will follow up.    Expected Discharge Plan: Skilled Nursing Facility Barriers to Discharge: SNF Pending bed offer  Expected Discharge Plan and Services Expected Discharge Plan: Skilled Nursing Facility Discharge Planning Services: CM Consult Post Acute Care Choice: Skilled Nursing Facility Living arrangements for the past 2 months: Apartment                           Social Determinants of Health (SDOH) Interventions    Readmission Risk Interventions  No flowsheet data found.

## 2018-06-23 NOTE — TOC Progression Note (Signed)
Transition of Care [facility]) - Progression Note    Patient Details  Name: ZYIAH GELPI MRN: [id number] Date of Birth: 1924-05-16  Transition of Care [facility]) CM/SW Contact  Allayne Butcher, RN Phone Number: 06/23/2018, 5:08 PM  Clinical Narrative:    Patient and family offered choice of facility- [facility] was the only facility to offer a bed, family accepts bed offer.  [facility] will be able to accept patient tomorrow.  Son is at the bedside he will go to the facility and fill out intake forms tomorrow.  Family is aware that they need to file for long term medicaid and will need to provide upfront payment that will be based on the patient's income.  RNCM will contact facility tomorrow morning and have EMS transport arranged.    Expected Discharge Plan: Skilled Nursing Facility Barriers to Discharge: SNF Pending bed offer  Expected Discharge Plan and Services Expected Discharge Plan: Skilled Nursing Facility Discharge Planning Services: CM Consult Post Acute Care Choice: Skilled Nursing Facility Living arrangements for the past 2 months: Apartment                           Social Determinants of Health (SDOH) Interventions    Readmission Risk Interventions  No flowsheet data found.

## 2018-06-23 NOTE — ED Notes (Signed)
Pt provided breakfast tray; able to feed self. 

## 2018-06-23 NOTE — ED Notes (Signed)
Call to case management to inform her that pt is agreeing to be transferred to SNF

## 2018-06-23 NOTE — TOC Progression Note (Signed)
Transition of Care [facility]) - Progression Note    Patient Details  Name: Leslie Huffman MRN: [id number] Date of Birth: Oct 20, 1924  Transition of Care [facility]) CM/SW Contact  Allayne Butcher, RN Phone Number: 06/23/2018, 11:37 AM  Clinical Narrative:    Patient has received no bed offers.  RNCM sent out referral to additional agencies in [place] and surrounding areas.     Expected Discharge Plan: Skilled Nursing Facility    Expected Discharge Plan and Services Expected Discharge Plan: Skilled Nursing Facility Discharge Planning Services: CM Consult   Living arrangements for the past 2 months: Apartment                           Social Determinants of Health (SDOH) Interventions    Readmission Risk Interventions  No flowsheet data found.

## 2018-06-24 ENCOUNTER — Inpatient Hospital Stay
Admission: EM | Admit: 2018-06-24 | Discharge: 2018-06-26 | DRG: 871 | Disposition: A | Discharge status: Skilled Nursing Facility | Payer: Medicare Other | Source: Skilled Nursing Facility | Attending: Internal Medicine | Admitting: Internal Medicine

## 2018-06-24 ENCOUNTER — Emergency Department: Payer: Medicare Other

## 2018-06-24 DIAGNOSIS — N390 Urinary tract infection, site not specified: Secondary | ICD-10-CM | POA: Diagnosis present

## 2018-06-24 DIAGNOSIS — S42302D Unspecified fracture of shaft of humerus, left arm, subsequent encounter for fracture with routine healing: Secondary | ICD-10-CM

## 2018-06-24 DIAGNOSIS — I2510 Atherosclerotic heart disease of native coronary artery without angina pectoris: Secondary | ICD-10-CM | POA: Diagnosis present

## 2018-06-24 DIAGNOSIS — I252 Old myocardial infarction: Secondary | ICD-10-CM

## 2018-06-24 DIAGNOSIS — G9341 Metabolic encephalopathy: Secondary | ICD-10-CM | POA: Diagnosis present

## 2018-06-24 DIAGNOSIS — I447 Left bundle-branch block, unspecified: Secondary | ICD-10-CM | POA: Diagnosis present

## 2018-06-24 DIAGNOSIS — Z66 Do not resuscitate: Secondary | ICD-10-CM | POA: Diagnosis present

## 2018-06-24 DIAGNOSIS — Z79890 Hormone replacement therapy (postmenopausal): Secondary | ICD-10-CM | POA: Diagnosis not present

## 2018-06-24 DIAGNOSIS — R509 Fever, unspecified: Secondary | ICD-10-CM

## 2018-06-24 DIAGNOSIS — N179 Acute kidney failure, unspecified: Secondary | ICD-10-CM | POA: Diagnosis present

## 2018-06-24 DIAGNOSIS — Z79899 Other long term (current) drug therapy: Secondary | ICD-10-CM | POA: Diagnosis not present

## 2018-06-24 DIAGNOSIS — A419 Sepsis, unspecified organism: Secondary | ICD-10-CM | POA: Diagnosis present

## 2018-06-24 DIAGNOSIS — E039 Hypothyroidism, unspecified: Secondary | ICD-10-CM | POA: Diagnosis present

## 2018-06-24 DIAGNOSIS — W19XXXD Unspecified fall, subsequent encounter: Secondary | ICD-10-CM | POA: Diagnosis present

## 2018-06-24 DIAGNOSIS — R4182 Altered mental status, unspecified: Secondary | ICD-10-CM

## 2018-06-24 DIAGNOSIS — Z882 Allergy status to sulfonamides status: Secondary | ICD-10-CM

## 2018-06-24 DIAGNOSIS — I10 Essential (primary) hypertension: Secondary | ICD-10-CM | POA: Diagnosis present

## 2018-06-24 DIAGNOSIS — Z7982 Long term (current) use of aspirin: Secondary | ICD-10-CM | POA: Diagnosis not present

## 2018-06-24 DIAGNOSIS — Z79891 Long term (current) use of opiate analgesic: Secondary | ICD-10-CM

## 2018-06-24 LAB — COMPREHENSIVE METABOLIC PANEL
ALBUMIN: 3.6 g/dL (ref 3.5–5.0)
ALT: 57 U/L — ABNORMAL HIGH (ref 0–44)
AST: 49 U/L — ABNORMAL HIGH (ref 15–41)
Alkaline Phosphatase: 91 U/L (ref 38–126)
Anion gap: 9 (ref 5–15)
BUN: 28 mg/dL — ABNORMAL HIGH (ref 8–23)
CO2: 29 mmol/L (ref 22–32)
Calcium: 8.8 mg/dL — ABNORMAL LOW (ref 8.9–10.3)
Chloride: 99 mmol/L (ref 98–111)
Creatinine, Ser: 1.3 mg/dL — ABNORMAL HIGH (ref 0.44–1.00)
GFR calc Af Amer: 42 mL/min — ABNORMAL LOW (ref >60)
GFR calc non Af Amer: 36 mL/min — ABNORMAL LOW (ref >60)
Glucose, Bld: 144 mg/dL — ABNORMAL HIGH (ref 70–99)
Potassium: 4.3 mmol/L (ref 3.5–5.1)
Sodium: 137 mmol/L (ref 135–145)
Total Bilirubin: 0.9 mg/dL (ref 0.3–1.2)
Total Protein: 6.6 g/dL (ref 6.5–8.1)

## 2018-06-24 LAB — CBC WITH DIFFERENTIAL/PLATELET
ABS IMMATURE GRANULOCYTES: 0.1 10*3/uL — AB (ref 0.00–0.07)
Basophils Absolute: 0 10*3/uL (ref 0.0–0.1)
Basophils Relative: 0 %
Eosinophils Absolute: 0.1 10*3/uL (ref 0.0–0.5)
Eosinophils Relative: 1 %
HCT: 32 % — ABNORMAL LOW (ref 36.0–46.0)
Hemoglobin: 10.4 g/dL — ABNORMAL LOW (ref 12.0–15.0)
Immature Granulocytes: 1 %
Lymphocytes Relative: 15 %
Lymphs Abs: 1.4 10*3/uL (ref 0.7–4.0)
MCH: 32.3 pg (ref 26.0–34.0)
MCHC: 32.5 g/dL (ref 30.0–36.0)
MCV: 99.4 fL (ref 80.0–100.0)
Monocytes Absolute: 1 10*3/uL (ref 0.1–1.0)
Monocytes Relative: 11 %
NRBC: 0 % (ref 0.0–0.2)
Neutro Abs: 6.9 10*3/uL (ref 1.7–7.7)
Neutrophils Relative %: 72 %
Platelets: 213 10*3/uL (ref 150–400)
RBC: 3.22 MIL/uL — ABNORMAL LOW (ref 3.87–5.11)
RDW: 14.3 % (ref 11.5–15.5)
WBC: 9.6 10*3/uL (ref 4.0–10.5)

## 2018-06-24 LAB — LACTIC ACID, PLASMA
Lactic Acid, Venous: 1 mmol/L (ref 0.5–1.9)
Lactic Acid, Venous: 0.7 mmol/L (ref 0.5–1.9)

## 2018-06-24 LAB — URINALYSIS, ROUTINE W REFLEX MICROSCOPIC
BILIRUBIN URINE: NEGATIVE
Bacteria, UA: NONE SEEN
Glucose, UA: NEGATIVE mg/dL
Ketones, ur: NEGATIVE mg/dL
Nitrite: NEGATIVE
Protein, ur: NEGATIVE mg/dL
Specific Gravity, Urine: 1.012 (ref 1.005–1.030)
pH: 5 (ref 5.0–8.0)

## 2018-06-24 LAB — INFLUENZA PANEL BY PCR (TYPE A & B)
INFLBPCR: NEGATIVE
Influenza A By PCR: NEGATIVE

## 2018-06-24 MED ORDER — POLYETHYLENE GLYCOL 3350 17 G PO PACK
17.0000 g | PACK | Freq: Every day | ORAL | Status: DC | PRN
  Administered 2018-06-25: 17 g via ORAL
  Filled 2018-06-24: qty 1

## 2018-06-24 MED ORDER — SODIUM CHLORIDE 0.9 % IV SOLN
1.0000 g | INTRAVENOUS | Status: DC
  Administered 2018-06-25: 1 g via INTRAVENOUS
  Filled 2018-06-24 (×2): qty 10

## 2018-06-24 MED ORDER — TRAMADOL HCL 50 MG PO TABS
50.0000 mg | ORAL_TABLET | Freq: Two times a day (BID) | ORAL | Status: DC | PRN
  Administered 2018-06-24 – 2018-06-26 (×2): 50 mg via ORAL
  Filled 2018-06-24 (×2): qty 1

## 2018-06-24 MED ORDER — SODIUM CHLORIDE 0.9 % IV SOLN
2.0000 g | Freq: Once | INTRAVENOUS | Status: AC
  Administered 2018-06-24: 2 g via INTRAVENOUS
  Filled 2018-06-24: qty 2

## 2018-06-24 MED ORDER — METOPROLOL SUCCINATE ER 50 MG PO TB24
50.0000 mg | ORAL_TABLET | Freq: Every day | ORAL | Status: DC
  Administered 2018-06-25 – 2018-06-26 (×2): 50 mg via ORAL
  Filled 2018-06-24 (×2): qty 1

## 2018-06-24 MED ORDER — ONDANSETRON HCL 4 MG/2ML IJ SOLN: 4.0000 mg | Freq: Four times a day (QID) | INTRAMUSCULAR | Status: DC | PRN

## 2018-06-24 MED ORDER — METRONIDAZOLE IN NACL 5-0.79 MG/ML-% IV SOLN
500.0000 mg | Freq: Once | INTRAVENOUS | Status: AC
  Administered 2018-06-24: 500 mg via INTRAVENOUS
  Filled 2018-06-24: qty 100

## 2018-06-24 MED ORDER — LEVOTHYROXINE SODIUM 50 MCG PO TABS: 50.0000 ug | ORAL_TABLET | Freq: Every day | ORAL | Status: DC

## 2018-06-24 MED ORDER — SODIUM CHLORIDE 0.9 % IV SOLN
INTRAVENOUS | Status: AC
  Administered 2018-06-24 – 2018-06-25 (×2): via INTRAVENOUS

## 2018-06-24 MED ORDER — ENOXAPARIN SODIUM 30 MG/0.3ML ~~LOC~~ SOLN
30.0000 mg | SUBCUTANEOUS | Status: DC
  Administered 2018-06-25 (×2): 30 mg via SUBCUTANEOUS
  Filled 2018-06-24 (×2): qty 0.3

## 2018-06-24 MED ORDER — VANCOMYCIN HCL IN DEXTROSE 1-5 GM/200ML-% IV SOLN: 1000.0000 mg | Freq: Once | INTRAVENOUS | Status: DC

## 2018-06-24 MED ORDER — ONDANSETRON HCL 4 MG PO TABS: 4.0000 mg | ORAL_TABLET | Freq: Four times a day (QID) | ORAL | Status: DC | PRN

## 2018-06-24 MED ORDER — VANCOMYCIN HCL 10 G IV SOLR
1250.0000 mg | Freq: Once | INTRAVENOUS | Status: AC
  Administered 2018-06-24: 1250 mg via INTRAVENOUS
  Filled 2018-06-24: qty 1250

## 2018-06-24 MED ORDER — ACETAMINOPHEN 500 MG PO TABS
1000.0000 mg | ORAL_TABLET | Freq: Four times a day (QID) | ORAL | Status: DC | PRN
  Administered 2018-06-25 (×2): 1000 mg via ORAL
  Filled 2018-06-24 (×3): qty 2

## 2018-06-24 MED ORDER — ASPIRIN EC 81 MG PO TBEC
81.0000 mg | DELAYED_RELEASE_TABLET | Freq: Every day | ORAL | Status: DC
  Administered 2018-06-25 – 2018-06-26 (×2): 81 mg via ORAL
  Filled 2018-06-24 (×2): qty 1

## 2018-06-24 MED ORDER — OXYCODONE HCL 5 MG PO TABS: 5.0000 mg | ORAL_TABLET | Freq: Four times a day (QID) | ORAL | Status: DC | PRN

## 2018-06-24 NOTE — Consult Note (Signed)
CODE SEPSIS - PHARMACY COMMUNICATION  **Broad Spectrum Antibiotics should be administered within 1 hour of Sepsis diagnosis**  Time Code Sepsis Called/Page Received: 1756  Antibiotics Ordered: vancomycin and cefepime  Time of 1st antibiotic administration: 1823  Additional action taken by pharmacy: none required  If necessary, Name of Provider/Nurse Contacted: N/A    Lowella Bandy ,PharmD Clinical Pharmacist  06/24/2018  6:34 PM

## 2018-06-24 NOTE — ED Notes (Signed)
To room to round on pt, pt has pulled off O2 and sats in 80s.  Pt confused and needed coaxing to replace O2.  Oxygen back in place, pt resting quietly on hospital bed, awaiting placement, family aware of same, will continue o monitor.

## 2018-06-24 NOTE — Progress Notes (Signed)
Family Meeting Note  Advance Directive:yes  Today a meeting took place with the Patient, son and granddaughter at bedside  Patient is unable to participate due ME:BRAXEN capacity Metabolic encephalopathy   The following clinical team members were present during this meeting:MD  The following were discussed:Patient's diagnosis: Acute metabolic encephalopathy, sepsis probably from UTI, essential hypertension, left humerus fracture, hypothyroidism, started on IV fluids and antibiotics and admitted to the hospital.  The treatment plan of care discussed in detail with the patient her son and granddaughter at bedside.  They  verbalized understanding of the plan.    Patient's progosis: Unable to determine and Goals for treatment: DNR  Son Jorja Loa is the healthcare POA  Additional follow-up to be provided: Hospitalist  Time spent during discussion:17 MIN  Ramonita Lab, MD

## 2018-06-24 NOTE — NC FL2 (Signed)
[place] MEDICAID FL2 LEVEL OF CARE SCREENING TOOL     IDENTIFICATION  Patient Name: Leslie Huffman Birthdate: 1924-05-31 Sex: female Admission Date (Current Location): 06/22/2018  [place] and [place] Number:  [address] [id number] Facility and Address:  [facility], [address] [place], [place] [address]      Provider Number: [id number]  Attending Physician Name and Address:  No att. providers found  Relative Name and Phone Number:       Current Level of Care: Hospital Recommended Level of Care: Skilled Nursing Facility Prior Approval Number:    Date Approved/Denied:   PASRR Number: [id number]  Discharge Plan: SNF    Current Diagnoses: There are no active problems to display for this patient.   Orientation RESPIRATION BLADDER Height & Weight     Self, Time, Situation, Place  Normal Continent Weight: 59 kg Height:  5\' 2"  (157.5 cm)  BEHAVIORAL SYMPTOMS/MOOD NEUROLOGICAL BOWEL NUTRITION STATUS      Continent Diet  AMBULATORY STATUS COMMUNICATION OF NEEDS Skin   Extensive Assist Verbally Normal                       Personal Care Assistance Level of Assistance  Bathing, Feeding, Dressing Bathing Assistance: Maximum assistance Feeding assistance: Limited assistance Dressing Assistance: Maximum assistance Total Care Assistance: Maximum assistance   Functional Limitations Info             SPECIAL CARE FACTORS FREQUENCY  PT (By licensed PT), OT (By licensed OT)     PT Frequency: 3-5 x week OT Frequency: 3-5 x week            Contractures Contractures Info: Not present    Additional Factors Info  Code Status, Allergies Code Status Info: Full Allergies Info: Sulfa antibiotics           Current Medications (06/24/2018):  This is the current hospital active medication list Current Facility-Administered Medications  Medication Dose Route Frequency Provider Last Rate Last Dose  . levothyroxine (SYNTHROID,  LEVOTHROID) tablet 50 mcg  50 mcg Oral Q0600 Jeanmarie Plant, MD   50 mcg at 06/23/18 0616  . lisinopril (PRINIVIL,ZESTRIL) tablet 20 mg  20 mg Oral Daily McShane, James A, MD      . metoprolol succinate (TOPROL-XL) 24 hr tablet 50 mg  50 mg Oral Daily Jeanmarie Plant, MD   Stopped at 06/23/18 [phone]  . ondansetron (ZOFRAN-ODT) disintegrating tablet 4 mg  4 mg Oral Q8H PRN Minna Antis, MD   4 mg at 06/22/18 1943  . oxyCODONE-acetaminophen (PERCOCET/ROXICET) 5-325 MG per tablet 1-2 tablet  1-2 tablet Oral Q4H PRN Minna Antis, MD   1 tablet at 06/23/18 2131  . traMADol (ULTRAM) tablet 50 mg  50 mg Oral Q12H PRN Jeanmarie Plant, MD      . triamterene-hydrochlorothiazide (MAXZIDE-25) 37.5-25 MG per tablet 1 tablet  1 tablet Oral Daily Jeanmarie Plant, MD       Current Outpatient Medications  Medication Sig Dispense Refill  . aspirin EC 81 MG tablet Take 81 mg by mouth daily.     Marland Kitchen acetaminophen (TYLENOL) 500 MG tablet Take 1,000 mg by mouth every 6 (six) hours as needed for moderate pain.    . Calcium Carbonate-Vitamin D 600-400 MG-UNIT chew tablet Chew 1 tablet by mouth daily.    Marland Kitchen levothyroxine (SYNTHROID, LEVOTHROID) 50 MCG tablet Take 50 mcg by mouth daily before breakfast.     . lisinopril (PRINIVIL,ZESTRIL) 20 MG tablet  Take 20 mg by mouth daily.     . metoprolol succinate (TOPROL-XL) 50 MG 24 hr tablet Take 50 mg by mouth daily.     . Multiple Vitamins-Minerals (PRESERVISION AREDS) TABS Take 1 tablet by mouth daily.    . traMADol (ULTRAM) 50 MG tablet Take 1 tablet (50 mg total) by mouth every 12 (twelve) hours as needed for up to 7 days for severe pain. 14 tablet 0  . triamterene-hydrochlorothiazide (MAXZIDE-25) 37.5-25 MG tablet Take 0.5 tablets by mouth daily.        Discharge Medications: Please see discharge summary for a list of discharge medications.  Relevant Imaging Results:  Relevant Lab Results:   Additional Information    Allayne Butcher, RN

## 2018-06-24 NOTE — TOC Transition Note (Signed)
Transition of Care [facility]) - CM/SW Discharge Note   Patient Details  Name: LALAH CERRO MRN: [id number] Date of Birth: March 27, 1925  Transition of Care [facility]) CM/SW Contact:  Allayne Butcher, RN Phone Number: 06/24/2018, 11:01 AM   Clinical Narrative:    Stanton Kidney the admissions coordinator at [facility] ready for patient transfer.  Patient will transfer by EMS, ED RN will call report. Son Marcial Pacas will go to [facility] and sign admission paperwork.    Final next level of care: Skilled Nursing Facility([facility]) Barriers to Discharge: Barriers Resolved   Patient Goals and CMS Choice Patient states their goals for this hospitalization and ongoing recovery are:: Patient reports that she can no longer take care of herself and wants to find placement in a facility CMS Medicare.gov Compare Post Acute Care list provided to:: Patient Choice offered to / list presented to : Patient, Adult Children  Discharge Placement              Patient chooses bed at: [facility] Patient to be transferred to facility by: [facility] Name of family member notified: Lakeria Knabel (son) Patient and family notified of of transfer: 06/24/18  Discharge Plan and Services Discharge Planning Services: CM Consult Post Acute Care Choice: Skilled Nursing Facility                    Social Determinants of Health (SDOH) Interventions     Readmission Risk Interventions No flowsheet data found.

## 2018-06-24 NOTE — ED Provider Notes (Signed)
[facility] Emergency Department Provider Note  Time seen: 6:04 PM  I have reviewed the triage vital signs and the nursing notes.   HISTORY  Chief Complaint Altered Mental Status   HPI Leslie Huffman is a 85 y.o. female with a past medical history of MI, CAD, hypertension, vertigo, recent left humerus fracture who presents to the emergency department for altered mental status and fever.  According to EMS report and record review patient was seen in the emergency department 06/21/2018 after a fall found to have a proximal left humerus fracture.  Was ultimately discharged home with pain medication.  Patient returned the following day for increased pain.  Patient was boarded in the emergency department for 2 days and was discharged to rehab facility today.  However upon arriving to the rehab facility patient was noted to be confused and febrile and was brought back to the emergency department.  Here the patient is awake and alert however she is unable to tell me the year, but is oriented to person and place.  Patient is moaning at times, has difficulty keeping focused and answering questions or following commands.   Past Medical History:  Diagnosis Date  . Heart attack (HCC)   . Hypertension   . Vertigo     There are no active problems to display for this patient.   Past Surgical History:  Procedure Laterality Date  . HIP SURGERY      Prior to Admission medications   Medication Sig Start Date End Date Taking? Authorizing Provider  acetaminophen (TYLENOL) 500 MG tablet Take 1,000 mg by mouth every 6 (six) hours as needed for moderate pain.    [provider]  aspirin EC 81 MG tablet Take 81 mg by mouth daily.     [provider]  Calcium Carbonate-Vitamin D 600-400 MG-UNIT chew tablet Chew 1 tablet by mouth daily.    [provider]  levothyroxine (SYNTHROID, LEVOTHROID) 50 MCG tablet Take 50 mcg by mouth daily before breakfast.  11/26/17    [provider]  lisinopril (PRINIVIL,ZESTRIL) 20 MG tablet Take 20 mg by mouth daily.  02/02/15   [provider]  metoprolol succinate (TOPROL-XL) 50 MG 24 hr tablet Take 50 mg by mouth daily.     [provider]  Multiple Vitamins-Minerals (PRESERVISION AREDS) TABS Take 1 tablet by mouth daily.    [provider]  traMADol (ULTRAM) 50 MG tablet Take 1 tablet (50 mg total) by mouth every 12 (twelve) hours as needed for up to 7 days for severe pain. 06/21/18 06/28/18  Dionne Bucy, MD  triamterene-hydrochlorothiazide (MAXZIDE-25) 37.5-25 MG tablet Take 0.5 tablets by mouth daily.  02/02/15   [provider]    Allergies  Allergen Reactions  . Sulfa Antibiotics Nausea And Vomiting, Nausea Only and Other (See Comments)    Altered mental status Reaction: unknown Altered mental status Altered mental status    No family history on file.  Social History Social History   Tobacco Use  . Smoking status: Never Smoker  . Smokeless tobacco: Never Used  Substance Use Topics  . Alcohol use: No  . Drug use: No    Review of Systems Unable to obtain an adequate/accurate review of systems secondary to altered mental status. ____________________________________________   PHYSICAL EXAM:  VITAL SIGNS: ED Triage Vitals  Enc Vitals Group     BP --      Pulse Rate 06/24/18 1750 95     Resp --  Temp 06/24/18 1750 (!) 100.8 F (38.2 C)     Temp Source 06/24/18 1750 Axillary     SpO2 06/24/18 1749 95 %     Weight --      Height --      Head Circumference --      Peak Flow --      Pain Score --      Pain Loc --      Pain Edu? --      Excl. in GC? --     Constitutional: Patient is awake and alert, no acute distress. Eyes: Normal exam ENT   Head: Normocephalic and atraumatic.   Mouth/Throat: Mucous membranes are moist. Cardiovascular: Normal rate, regular rhythm. Respiratory: Normal respiratory effort without tachypnea nor  retractions. Breath sounds are clear  Gastrointestinal: Soft and nontender. No distention.   Musculoskeletal: Nontender with normal range of motion in all extremities.  Neurologic:  Normal speech and language. No gross focal neurologic deficits Skin:  Skin is warm, dry and intact.  Psychiatric: Mood and affect are normal.   ____________________________________________    EKG  EKG viewed and interpreted by myself shows sinus tachycardia at 104 bpm with a widened QRS, normal axis, largely normal intervals, nonspecific ST changes morphology consistent with left bundle branch block.  Prior EKG also shows left bundle branch block.  ____________________________________________    RADIOLOGY  Chest x-ray is negative  ____________________________________________   INITIAL IMPRESSION / ASSESSMENT AND PLAN / ED COURSE  Pertinent labs & imaging results that were available during my care of the patient were reviewed by me and considered in my medical decision making (see chart for details).  Patient presents to the emergency department for altered mental status confusion found to be febrile.  Differential would include infectious etiologies such as pneumonia, influenza, urinary tract infection.  We will check labs, chest x-ray, influenza test, urinalysis and continue to closely monitor.  Given the patient's altered mental status was elevated temperature 102.2 we will start broad-spectrum antibiotics while awaiting results.  Patient's labs have resulted most consistent with a urinary tract infection, blood cultures and urine culture pending.  Chest x-ray negative.  Given her altered mental status with urinary tract infection and febrile patient will be admitted to the emergency department for further treatment.  ____________________________________________   FINAL CLINICAL IMPRESSION(S) / ED DIAGNOSES  Altered mental status Fever Urinary tract infection   Minna Antis, MD 06/24/18  1904

## 2018-06-24 NOTE — ED Notes (Signed)
Report was given to [facility] at [facility].

## 2018-06-24 NOTE — H&P (Signed)
[place] at [place] NAME: Leslie Huffman    MR#:  [id number]  DATE OF BIRTH:  30-Aug-1924  DATE OF ADMISSION:  06/24/2018  PRIMARY CARE PHYSICIAN: Juluis Pitch, MD   REQUESTING/REFERRING PHYSICIAN: Harvest Dark, MD  CHIEF COMPLAINT:   Altered mental status and fever HISTORY OF PRESENT ILLNESS:  Leslie Huffman  is a 85 y.o. female with a known history of coronary artery disease, hypertension, vertigo was at [facility] emergency department from 06/21/2018 after she sustained a fall.  Patient is brought into the emergency department and was diagnosed with proximal left humerus fracture.  Patient was given pain medication and discharged home as the fracture is nonsurgical.  Unfortunately patient came back in the following day for increased pain.  Patient was kept in the emergency department for 2 more days and was discharged to [place] rehab center today.  However patient was febrile soon after she reached the rehab center and she was sent back to the emergency department.  Patient was febrile with a temp of 102.5 and tachycardic and urinalysis was abnormal and hospitalist team is called admit the patient.  Patient was cultured and given IV antibiotics cefepime and vancomycin.  Patient is altered during my examination son team and granddaughter were present bedside.  According to the family members patient does not have any history of dementia  PAST MEDICAL HISTORY:   Past Medical History:  Diagnosis Date  . Heart attack ([place])   . Hypertension   . Vertigo     PAST SURGICAL HISTOIRY:   Past Surgical History:  Procedure Laterality Date  . HIP SURGERY      SOCIAL HISTORY:   Social History   Tobacco Use  . Smoking status: Never Smoker  . Smokeless tobacco: Never Used  Substance Use Topics  . Alcohol use: No    FAMILY HISTORY:  No family history on file.  DRUG ALLERGIES:   Allergies   Allergen Reactions  . Sulfa Antibiotics Nausea And Vomiting, Nausea Only and Other (See Comments)    Altered mental status Reaction: unknown Altered mental status Altered mental status    REVIEW OF SYSTEMS:  Review of system unobtainable as the patient is altered  MEDICATIONS AT HOME:   Prior to Admission medications   Medication Sig Start Date End Date Taking? Authorizing Provider  acetaminophen (TYLENOL) 500 MG tablet Take 1,000 mg by mouth every 6 (six) hours as needed for moderate pain.   Yes [provider]  aspirin EC 81 MG tablet Take 81 mg by mouth daily.    Yes [provider]  Calcium Carbonate-Vitamin D 600-400 MG-UNIT chew tablet Chew 1 tablet by mouth daily.   Yes [provider]  levothyroxine (SYNTHROID, LEVOTHROID) 50 MCG tablet Take 50 mcg by mouth daily before breakfast.  11/26/17  Yes [provider]  lisinopril (PRINIVIL,ZESTRIL) 20 MG tablet Take 20 mg by mouth daily.  02/02/15  Yes [provider]  metoprolol succinate (TOPROL-XL) 50 MG 24 hr tablet Take 50 mg by mouth daily.    Yes [provider]  Multiple Vitamins-Minerals (PRESERVISION AREDS) TABS Take 1 tablet by mouth daily.   Yes [provider]  traMADol (ULTRAM) 50 MG tablet Take 1 tablet (50 mg total) by mouth every 12 (twelve) hours as needed for up to 7 days for severe pain. 06/21/18 06/28/18 Yes Arta Silence, MD  triamterene-hydrochlorothiazide (MAXZIDE-25) 37.5-25 MG tablet Take 0.5 tablets by mouth daily.  02/02/15  Yes [provider]      VITAL SIGNS:  Blood pressure 131/63, pulse 85, temperature 99.9 F (37.7 C), temperature source Oral, resp. rate 16, SpO2 96 %.  PHYSICAL EXAMINATION:  GENERAL:  85 y.o.-year-old patient lying in the bed with no acute distress.  EYES: Pupils equal, round, reactive to light and accommodation. No scleral icterus. Extraocular muscles intact.  HEENT: Head atraumatic, normocephalic.  Oropharynx and nasopharynx clear.  NECK:  Supple, no jugular venous distention. No thyroid enlargement, no tenderness.  LUNGS: Normal breath sounds bilaterally, no wheezing, rales,rhonchi or crepitation. No use of accessory muscles of respiration.  CARDIOVASCULAR: S1, S2 normal. No murmurs, rubs, or gallops.  ABDOMEN: Soft, nontender, nondistended. Bowel sounds present.  EXTREMITIES: Left upper arm in sling and tender, multiple bruises are noticed, no pedal edema, cyanosis, or clubbing.  NEUROLOGIC: Awake and alert but disoriented sensation intact. Gait not checked.  PSYCHIATRIC: The patient is alert and disoriented SKIN: No obvious rash, lesion, or ulcer.   LABORATORY PANEL:   CBC Recent Labs  Lab 06/24/18 1800  WBC 9.6  HGB 10.4*  HCT 32.0*  PLT 213   ------------------------------------------------------------------------------------------------------------------  Chemistries  Recent Labs  Lab 06/24/18 1800  NA 137  K 4.3  CL 99  CO2 29  GLUCOSE 144*  BUN 28*  CREATININE 1.30*  CALCIUM 8.8*  AST 49*  ALT 57*  ALKPHOS 91  BILITOT 0.9   ------------------------------------------------------------------------------------------------------------------  Cardiac Enzymes No results for input(s): TROPONINI in the last 168 hours. ------------------------------------------------------------------------------------------------------------------  RADIOLOGY:  Dg Chest Port 1 View  Result Date: 06/24/2018 CLINICAL DATA:  Pt was discharge from [facility] hospital today for left broken shoulder. Per nursing facility staff, patient was AMS at time of arrival from hospital with low oxygen saturation EXAM: PORTABLE CHEST 1 VIEW COMPARISON:  07/07/2014 FINDINGS: Cardiac silhouette is normal in size. No mediastinal or hilar masses. There is lung base opacity consistent with atelectasis. Remainder of the lungs is clear. Lung volumes are low. Partly imaged left proximal humerus fracture is  without significant change from the shoulder radiographs dated 06/21/2018. Chronic changes from prior right clavicle ORIF. Skeletal structures are demineralized. IMPRESSION: 1. No acute cardiopulmonary disease. 2. Low lung volumes with lung base atelectasis. Electronically Signed   By: Lajean Manes M.D.   On: 06/24/2018 18:46    EKG:   Orders placed or performed during the hospital encounter of 06/24/18  . EKG 12-Lead  . EKG 12-Lead  . ED EKG 12-Lead  . ED EKG 12-Lead    IMPRESSION AND PLAN:     #Sepsis -probably from UTI Admit to MedSurg unit Met septic criteria at the time of admission with fever and tachycardia Blood cultures and urine cultures ordered Patient was given cefepime and vancomycin in the ED but the source seem to be UTI, will continue Rocephin IV Hydrate with IV fluids Lactic acid and procalcitonin levels to be monitored  #Acute metabolic encephalopathy from sepsis Monitor patient closely continue IV fluids and antibiotics  #Left humerus fracture-nonsurgical Continue sling and pain management as needed  #AKI hydrate with IV fluids avoid nephrotoxins and hold lisinopril and hydrochlorothiazide  #Hypertension-continue home medication metoprolol and hold lisinopril  #Hypothyroidism continue Synthyroid home medication  D VT prophylaxis with Lovenox subcu  All the records are reviewed and case discussed with ED provider. Management plans discussed with the patient, family and they are in agreement.  CODE STATUS: dnr  TOTAL TIME TAKING CARE OF THIS PATIENT: 43  minutes.  Note: This dictation was prepared with Dragon dictation along with smaller phrase technology. Any transcriptional errors that result from this process are unintentional.  Nicholes Mango M.D on 06/24/2018 at 8:14 PM  Between 7am to 6pm - Pager - [phone]  After 6pm go to [email or web address] - password [email or web address] [facility]  Office  [phone]  CC: Primary care  physician; Juluis Pitch, MD

## 2018-06-24 NOTE — ED Triage Notes (Addendum)
Pt arrived from [facility] facility via [facility]. Per EMS, pt was discharge from [facility] ED today for a  left broken shoulder. Per nursing facility staff, patient was AMS at the time of arrival from hospital with low oxygen saturation at 88%. Upon arrival to ED pt was AMS and SpO2 at 87% on RA. Pt placed on 2L [place] resulting in 97%SpO2.

## 2018-06-24 NOTE — ED Notes (Signed)
RN attempted to call report to [facility] at this time. Not successful RN will monitor

## 2018-06-24 NOTE — ED Notes (Signed)
This RN received call from [facility], inquiring on report. Rn explained report was given to [place] this morning. Facality states she needs report, report was given.

## 2018-06-24 NOTE — ED Notes (Signed)
Pt was given oatmeal at this time. Family is at bedside. Pt states pain meds made her feel different last night and family requesting her not to receive them.   Pt is transferring to [facility] Rm [address] Bed A

## 2018-06-24 NOTE — ED Notes (Signed)
ED TO INPATIENT HANDOFF REPORT  ED Nurse Name and Phone #:  Terance Hart, RN  [phone] S Name/Age/Gender Leslie Huffman 85 y.o. female Room/Bed: ED09A/ED09A  Code Status   Code Status: Not on file  Home/SNF/Other Rehab Patient oriented to: self Is this baseline? No   Triage Complete: Triage complete  Chief Complaint alt mental status  Triage Note Pt arrived from [facility] facility via [facility]. Per EMS, pt was discharge from [facility] ED today for a  left broken shoulder. Per nursing facility staff, patient was AMS at the time of arrival from hospital with low oxygen saturation at 88%. Upon arrival to ED pt was AMS and SpO2 at 87% on RA. Pt placed on 2L [place] resulting in 97%SpO2.    Allergies Allergies  Allergen Reactions  . Sulfa Antibiotics Nausea And Vomiting, Nausea Only and Other (See Comments)    Altered mental status Reaction: unknown Altered mental status Altered mental status    Level of Care/Admitting Diagnosis ED Disposition    ED Disposition Condition Comment   Admit  Hospital Area: [facility] [[id number]]  Level of Care: Med-Surg [16]  Diagnosis: Sepsis [facility]) [[id number]]  Admitting Physician: Ramonita Lab [[id number]]  Attending Physician: Ramonita Lab [[id number]]  Estimated length of stay: 3 - 4 days  Certification:: I certify this patient will need inpatient services for at least 2 midnights  Bed request comments: 1c  PT Class (Do Not Modify): Inpatient [101]  PT Acc Code (Do Not Modify): Private [1]       B Medical/Surgery History Past Medical History:  Diagnosis Date  . Heart attack (HCC)   . Hypertension   . Vertigo    Past Surgical History:  Procedure Laterality Date  . HIP SURGERY       A IV Location/Drains/Wounds Patient Lines/Drains/Airways Status   Active Line/Drains/Airways    Name:   Placement date:   Placement time:   Site:   Days:   Peripheral IV 06/22/18 Right Antecubital   06/22/18    -    Antecubital   2   Peripheral IV 06/24/18 Right Antecubital   06/24/18    1814    Antecubital   less than 1   Peripheral IV 06/24/18 Right Hand   06/24/18    1814    Hand   less than 1   External Urinary Catheter   06/22/18    1900    -   2   External Urinary Catheter   06/24/18    2025    -   less than 1          Intake/Output Last 24 hours  Intake/Output Summary (Last 24 hours) at 06/24/2018 2210 Last data filed at 06/24/2018 2025 Gross per 24 hour  Intake 188.56 ml  Output -  Net 188.56 ml    Labs/Imaging Results for orders placed or performed during the hospital encounter of 06/24/18 (from the past 48 hour(s))  Lactic acid, plasma     Status: None   Collection Time: 06/24/18  6:00 PM  Result Value Ref Range   Lactic Acid, Venous 1.0 0.5 - 1.9 mmol/L    Comment: Performed at [facility], [address] [place], [place] [address]  Comprehensive metabolic panel     Status: Abnormal   Collection Time: 06/24/18  6:00 PM  Result Value Ref Range   Sodium 137 135 - 145 mmol/L   Potassium 4.3 3.5 - 5.1 mmol/L   Chloride 99  98 - 111 mmol/L   CO2 29 22 - 32 mmol/L   Glucose, Bld 144 (H) 70 - 99 mg/dL   BUN 28 (H) 8 - 23 mg/dL   Creatinine, Ser 7.421.30 (H) 0.44 - 1.00 mg/dL   Calcium 8.8 (L) 8.9 - 10.3 mg/dL   Total Protein 6.6 6.5 - 8.1 g/dL   Albumin 3.6 3.5 - 5.0 g/dL   AST 49 (H) 15 - 41 U/L   ALT 57 (H) 0 - 44 U/L   Alkaline Phosphatase 91 38 - 126 U/L   Total Bilirubin 0.9 0.3 - 1.2 mg/dL   GFR calc non Af Amer 36 (L) >60 mL/min   GFR calc Af Amer 42 (L) >60 mL/min   Anion gap 9 5 - 15    Comment: Performed at [facility], [address] [place], [place] [address]  CBC WITH DIFFERENTIAL     Status: Abnormal   Collection Time: 06/24/18  6:00 PM  Result Value Ref Range   WBC 9.6 4.0 - 10.5 K/uL   RBC 3.22 (L) 3.87 - 5.11 MIL/uL   Hemoglobin 10.4 (L) 12.0 - 15.0 g/dL   HCT 87.532.0 (L) 64.336.0 - 32.946.0 %   MCV 99.4 80.0 - 100.0 fL   MCH 32.3 26.0 - 34.0 pg   MCHC 32.5 30.0 -  36.0 g/dL   RDW 51.814.3 84.111.5 - 66.015.5 %   Platelets 213 150 - 400 K/uL   nRBC 0.0 0.0 - 0.2 %   Neutrophils Relative % 72 %   Neutro Abs 6.9 1.7 - 7.7 K/uL   Lymphocytes Relative 15 %   Lymphs Abs 1.4 0.7 - 4.0 K/uL   Monocytes Relative 11 %   Monocytes Absolute 1.0 0.1 - 1.0 K/uL   Eosinophils Relative 1 %   Eosinophils Absolute 0.1 0.0 - 0.5 K/uL   Basophils Relative 0 %   Basophils Absolute 0.0 0.0 - 0.1 K/uL   Immature Granulocytes 1 %   Abs Immature Granulocytes 0.10 (H) 0.00 - 0.07 K/uL    Comment: Performed at [facility], [address] [place], [place] [address]  Urinalysis, Routine w reflex microscopic     Status: Abnormal   Collection Time: 06/24/18  6:01 PM  Result Value Ref Range   Color, Urine YELLOW (A) YELLOW   APPearance CLEAR (A) CLEAR   Specific Gravity, Urine 1.012 1.005 - 1.030   pH 5.0 5.0 - 8.0   Glucose, UA NEGATIVE NEGATIVE mg/dL   Hgb urine dipstick SMALL (A) NEGATIVE   Bilirubin Urine NEGATIVE NEGATIVE   Ketones, ur NEGATIVE NEGATIVE mg/dL   Protein, ur NEGATIVE NEGATIVE mg/dL   Nitrite NEGATIVE NEGATIVE   Leukocytes,Ua MODERATE (A) NEGATIVE   RBC / HPF 6-10 0 - 5 RBC/hpf   WBC, UA 21-50 0 - 5 WBC/hpf   Bacteria, UA NONE SEEN NONE SEEN   Squamous Epithelial / LPF 0-5 0 - 5   WBC Clumps PRESENT    Mucus PRESENT     Comment: Performed at [facility], [address] [place], [place] [address]  Influenza panel by PCR (type A & B)     Status: None   Collection Time: 06/24/18  7:24 PM  Result Value Ref Range   Influenza A By PCR NEGATIVE NEGATIVE   Influenza B By PCR NEGATIVE NEGATIVE    Comment: (NOTE) The Xpert Xpress Flu assay is intended as an aid in the diagnosis of  influenza and should not be used as a  sole basis for treatment.  This  assay is FDA approved for nasopharyngeal swab specimens only. Nasal  washings and aspirates are unacceptable for Xpert Xpress Flu testing. Performed at [facility], [address], [place], [place] [address]    Dg Chest [place] 1 View  Result Date: 06/24/2018 CLINICAL DATA:  Pt was discharge from [facility] hospital today for left broken shoulder. Per nursing facility staff, patient was AMS at time of arrival from hospital with low oxygen saturation EXAM: PORTABLE CHEST 1 VIEW COMPARISON:  07/07/2014 FINDINGS: Cardiac silhouette is normal in size. No mediastinal or hilar masses. There is lung base opacity consistent with atelectasis. Remainder of the lungs is clear. Lung volumes are low. Partly imaged left proximal humerus fracture is without significant change from the shoulder radiographs dated 06/21/2018. Chronic changes from prior right clavicle ORIF. Skeletal structures are demineralized. IMPRESSION: 1. No acute cardiopulmonary disease. 2. Low lung volumes with lung base atelectasis. Electronically Signed   By: Amie Portland M.D.   On: 06/24/2018 18:46    Pending Labs Unresulted Labs (From admission, onward)    Start     Ordered   06/24/18 1756  Lactic acid, plasma  Now then every 2 hours,   STAT     06/24/18 1758   06/24/18 1756  Blood Culture (routine x 2)  BLOOD CULTURE X 2,   STAT     06/24/18 1758   Signed and Held  [employer] morning,   R     Signed and Held   Signed and Held  Cortisol-am, blood  Tomorrow morning,   R     Signed and Held   Signed and Held  Procalcitonin  Tomorrow morning,   R     Signed and Held   Signed and Held  CBC  (enoxaparin (LOVENOX)    CrCl < 30 ml/min)  Once,   R    Comments:  Baseline for enoxaparin therapy IF NOT ALREADY DRAWN.  Notify MD if PLT < 100 K.    Signed and Held   Signed and Held  Creatinine, serum  (enoxaparin (LOVENOX)    CrCl < 30 ml/min)  Once,   R    Comments:  Baseline for enoxaparin therapy IF NOT ALREADY DRAWN.    Signed and Held   Signed and Held  Creatinine, serum  (enoxaparin (LOVENOX)    CrCl < 30 ml/min)  Weekly,   R    Comments:  while on enoxaparin therapy.    Signed and Held   Signed and  Held  Urine culture  Once,   R     Signed and Held   Signed and Held  Lactic acid, plasma  STAT Now then every 3 hours,   STAT     Signed and Held   Signed and Held  Comprehensive metabolic panel  Tomorrow morning,   R     Signed and Held   Signed and Held  CBC  Tomorrow morning,   R     Signed and Held          Vitals/Pain Today's Vitals   06/24/18 1750 06/24/18 1800 06/24/18 1927 06/24/18 2030  BP:   131/63 127/78  Pulse: 95  85 81  Resp:   16 13  Temp: (!) 100.8 F (38.2 C) (!) 102.2 F (39 C) 99.9 F (37.7 C)   TempSrc: Axillary Rectal Oral   SpO2: 96%  96% 98%    Isolation Precautions No active isolations  Medications Medications  traMADol (ULTRAM) tablet 50 mg (50 mg Oral Given 06/24/18 2042)  oxyCODONE (Oxy IR/ROXICODONE) immediate release tablet 5 mg (has no administration in time range)  ceFEPIme (MAXIPIME) 2 g in sodium chloride 0.9 % 100 mL IVPB ( Intravenous Stopped 06/24/18 1919)  metroNIDAZOLE (FLAGYL) IVPB 500 mg ( Intravenous Stopped 06/24/18 2020)  vancomycin (VANCOCIN) 1,250 mg in sodium chloride 0.9 % 250 mL IVPB (1,250 mg Intravenous New Bag/Given 06/24/18 2024)    Mobility walks with person assist     Focused Assessments Neuro Assessment Handoff:  Swallow screen pass? Yes          Neuro Assessment:   Neuro Checks:      Last Documented NIHSS Modified Score:   Has TPA been given? No If patient is a Neuro Trauma and patient is going to OR before floor call report to 4N Charge nurse: [phone] or [phone]     R Recommendations: See Admitting Provider Note  Report given to:   Additional Notes: Pt recently seen for for Left shoulder fx, immobilized at this time. D/c earlier today now with new onset AMS and UTI. Family at bedside.

## 2018-06-25 ENCOUNTER — Other Ambulatory Visit: Payer: Self-pay

## 2018-06-25 LAB — COMPREHENSIVE METABOLIC PANEL
ALBUMIN: 3 g/dL — AB (ref 3.5–5.0)
ALT: 45 U/L — ABNORMAL HIGH (ref 0–44)
AST: 34 U/L (ref 15–41)
Alkaline Phosphatase: 77 U/L (ref 38–126)
Anion gap: 8 (ref 5–15)
BUN: 29 mg/dL — ABNORMAL HIGH (ref 8–23)
CO2: 29 mmol/L (ref 22–32)
Calcium: 8.4 mg/dL — ABNORMAL LOW (ref 8.9–10.3)
Chloride: 101 mmol/L (ref 98–111)
Creatinine, Ser: 1.12 mg/dL — ABNORMAL HIGH (ref 0.44–1.00)
GFR calc Af Amer: 50 mL/min — ABNORMAL LOW (ref >60)
GFR calc non Af Amer: 43 mL/min — ABNORMAL LOW (ref >60)
GLUCOSE: 128 mg/dL — AB (ref 70–99)
POTASSIUM: 4 mmol/L (ref 3.5–5.1)
SODIUM: 138 mmol/L (ref 135–145)
Total Bilirubin: 0.7 mg/dL (ref 0.3–1.2)
Total Protein: 6.1 g/dL — ABNORMAL LOW (ref 6.5–8.1)

## 2018-06-25 LAB — BLOOD CULTURE ID PANEL (REFLEXED)

## 2018-06-25 LAB — MRSA PCR SCREENING: MRSA by PCR: NEGATIVE

## 2018-06-25 LAB — CBC
HCT: 29.6 % — ABNORMAL LOW (ref 36.0–46.0)
Hemoglobin: 9.4 g/dL — ABNORMAL LOW (ref 12.0–15.0)
MCH: 32.3 pg (ref 26.0–34.0)
MCHC: 31.8 g/dL (ref 30.0–36.0)
MCV: 101.7 fL — ABNORMAL HIGH (ref 80.0–100.0)
Platelets: 172 10*3/uL (ref 150–400)
RBC: 2.91 MIL/uL — ABNORMAL LOW (ref 3.87–5.11)
RDW: 14.3 % (ref 11.5–15.5)
WBC: 7.7 10*3/uL (ref 4.0–10.5)
nRBC: 0 % (ref 0.0–0.2)

## 2018-06-25 LAB — PROTIME-INR
INR: 1.1 (ref 0.8–1.2)
Prothrombin Time: 13.8 seconds (ref 11.4–15.2)

## 2018-06-25 LAB — CORTISOL-AM, BLOOD: Cortisol - AM: 11.9 ug/dL (ref 6.7–22.6)

## 2018-06-25 LAB — PROCALCITONIN: Procalcitonin: 0.1 ng/mL

## 2018-06-25 LAB — LACTIC ACID, PLASMA: Lactic Acid, Venous: 0.9 mmol/L (ref 0.5–1.9)

## 2018-06-25 MED ORDER — LEVOTHYROXINE SODIUM 50 MCG PO TABS
50.0000 ug | ORAL_TABLET | Freq: Every day | ORAL | Status: DC
  Administered 2018-06-25 – 2018-06-26 (×2): 50 ug via ORAL
  Filled 2018-06-25 (×2): qty 1

## 2018-06-25 MED ORDER — SODIUM CHLORIDE 0.9 % IV SOLN
2.0000 g | INTRAVENOUS | Status: DC
  Administered 2018-06-25: 2 g via INTRAVENOUS
  Filled 2018-06-25: qty 2
  Filled 2018-06-25: qty 20

## 2018-06-25 MED ORDER — ORAL CARE MOUTH RINSE: 15.0000 mL | Freq: Two times a day (BID) | OROMUCOSAL | Status: DC

## 2018-06-25 NOTE — Progress Notes (Signed)
PHARMACY - PHYSICIAN COMMUNICATION CRITICAL VALUE ALERT - BLOOD CULTURE IDENTIFICATION (BCID)  Leslie Huffman is an 85 y.o. female who presented to [facility] on 06/24/2018 with a chief complaint of sepsis from UTI.   Assessment:  Patient has bacteremia 2/2 to UTI. Blood culture was positive for Staphylococcus species (methicillin not detected). She is currently taking Rocephin 1 g every 24 hours. Patient would benefit from a dose increase of Rocephin to 2 g every 24 hours.   Name of physician (or Provider) Contacted: Dr. Vivien Rota Pyreddy  Current antibiotics: Rocephin 1 g every 24 hours  Changes to prescribed antibiotics recommended: Rocephin 2 g every 24 hours  Recommendations accepted by provider  Results for orders placed or performed during the hospital encounter of 06/24/18  Blood Culture ID Panel (Reflexed) (Collected: 06/24/2018  6:01 PM)  Result Value Ref Range   Enterococcus species NOT DETECTED NOT DETECTED   Listeria monocytogenes NOT DETECTED NOT DETECTED   Staphylococcus species DETECTED (A) NOT DETECTED   Staphylococcus aureus (BCID) NOT DETECTED NOT DETECTED   Methicillin resistance NOT DETECTED NOT DETECTED   Streptococcus species NOT DETECTED NOT DETECTED   Streptococcus agalactiae NOT DETECTED NOT DETECTED   Streptococcus pneumoniae NOT DETECTED NOT DETECTED   Streptococcus pyogenes NOT DETECTED NOT DETECTED   Acinetobacter baumannii NOT DETECTED NOT DETECTED   Enterobacteriaceae species NOT DETECTED NOT DETECTED   Enterobacter cloacae complex NOT DETECTED NOT DETECTED   Escherichia coli NOT DETECTED NOT DETECTED   Klebsiella oxytoca NOT DETECTED NOT DETECTED   Klebsiella pneumoniae NOT DETECTED NOT DETECTED   Proteus species NOT DETECTED NOT DETECTED   Serratia marcescens NOT DETECTED NOT DETECTED   Haemophilus influenzae NOT DETECTED NOT DETECTED   Neisseria meningitidis NOT DETECTED NOT DETECTED   Pseudomonas aeruginosa NOT DETECTED NOT DETECTED   Candida  albicans NOT DETECTED NOT DETECTED   Candida glabrata NOT DETECTED NOT DETECTED   Candida krusei NOT DETECTED NOT DETECTED   Candida parapsilosis NOT DETECTED NOT DETECTED   Candida tropicalis NOT DETECTED NOT DETECTED    Katha Cabal, PharmD 06/25/2018  3:59 PM

## 2018-06-25 NOTE — Progress Notes (Signed)
[facility] [place] [facility]   PATIENT NAME: Leslie Huffman    MR#:  [id number]  DATE OF BIRTH:  [birth year]  SUBJECTIVE:  CHIEF COMPLAINT:   Chief Complaint  Patient presents with  . Altered Mental Status  Seen and evaluated today More awake and alert Responds to verbal commands Has some pain in the left Has a sling to the left arm and shoulder  REVIEW OF SYSTEMS:    ROS  CONSTITUTIONAL: No documented fever. Has fatigue, weakness. No weight gain, no weight loss.  EYES: No blurry or double vision.  ENT: No tinnitus. No postnasal drip. No redness of the oropharynx.  RESPIRATORY: No cough, no wheeze, no hemoptysis. No dyspnea.  CARDIOVASCULAR: No chest pain. No orthopnea. No palpitations. No syncope.  GASTROINTESTINAL: No nausea, no vomiting or diarrhea. No abdominal pain. No melena or hematochezia.  GENITOURINARY: No dysuria or hematuria.  ENDOCRINE: No polyuria or nocturia. No heat or cold intolerance.  HEMATOLOGY: No anemia. No bruising. No bleeding.  INTEGUMENTARY: ecchymosis left arm MUSCULOSKELETAL: No arthritis. No swelling. No gout.  Left arm pain NEUROLOGIC: No numbness, tingling, or ataxia. No seizure-type activity.  PSYCHIATRIC: No anxiety. No insomnia. No ADD.   DRUG ALLERGIES:   Allergies  Allergen Reactions  . Sulfa Antibiotics Nausea And Vomiting, Nausea Only and Other (See Comments)    Altered mental status Reaction: unknown Altered mental status Altered mental status    VITALS:  Blood pressure (!) 120/43, pulse 65, temperature (!) 97.3 F (36.3 C), temperature source Oral, resp. rate 16, height 5' (1.524 m), weight 65.5 kg, SpO2 96 %.  PHYSICAL EXAMINATION:   Physical Exam  GENERAL:  85 y.o.-year-old patient lying in the bed with no acute distress.  EYES: Pupils equal, round, reactive to light and accommodation. No scleral icterus. Extraocular muscles intact.  HEENT: Head atraumatic, normocephalic. Oropharynx and nasopharynx  clear.  NECK:  Supple, no jugular venous distention. No thyroid enlargement, no tenderness.  LUNGS: Normal breath sounds bilaterally, no wheezing, rales, rhonchi. No use of accessory muscles of respiration.  CARDIOVASCULAR: S1, S2 normal. No murmurs, rubs, or gallops.  ABDOMEN: Soft, nontender, nondistended. Bowel sounds present. No organomegaly or mass.  EXTREMITIES: No cyanosis, clubbing or edema b/l.    Left arm sling NEUROLOGIC: Cranial nerves II through XII are intact. No focal Motor or sensory deficits b/l.   PSYCHIATRIC: The patient is alert and oriented x 3.  SKIN: ecchymosis left arm  LABORATORY PANEL:   CBC Recent Labs  Lab 06/25/18 0208  WBC 7.7  HGB 9.4*  HCT 29.6*  PLT 172   ------------------------------------------------------------------------------------------------------------------ Chemistries  Recent Labs  Lab 06/25/18 0208  NA 138  K 4.0  CL 101  CO2 29  GLUCOSE 128*  BUN 29*  CREATININE 1.12*  CALCIUM 8.4*  AST 34  ALT 45*  ALKPHOS 77  BILITOT 0.7   ------------------------------------------------------------------------------------------------------------------  Cardiac Enzymes No results for input(s): TROPONINI in the last 168 hours. ------------------------------------------------------------------------------------------------------------------  RADIOLOGY:  Dg Chest Port 1 View  Result Date: 06/24/2018 CLINICAL DATA:  Pt was discharge from [facility] hospital today for left broken shoulder. Per nursing facility staff, patient was AMS at time of arrival from hospital with low oxygen saturation EXAM: PORTABLE CHEST 1 VIEW COMPARISON:  07/07/2014 FINDINGS: Cardiac silhouette is normal in size. No mediastinal or hilar masses. There is lung base opacity consistent with atelectasis. Remainder of the lungs is clear. Lung volumes are low. Partly imaged left proximal humerus fracture is without significant change  from the shoulder radiographs dated  06/21/2018. Chronic changes from prior right clavicle ORIF. Skeletal structures are demineralized. IMPRESSION: 1. No acute cardiopulmonary disease. 2. Low lung volumes with lung base atelectasis. Electronically Signed   By: Amie Portland M.D.   On: 06/24/2018 18:46     ASSESSMENT AND PLAN:  85 year old female patient with history of coronary disease, hypertension, vertigo, left humerus fracture currently on sling under hospitalist service for metabolic encephalopathy  -Acute metabolic encephalopathy secondary to UTI improving Continue IV fluids and antibiotics  -Sepsis from UTI Continue IV antibiotics that is Rocephin Follow-up blood and urine cultures Follow-up lactic acid level  -Left humerus fracture Nonsurgical Family wants orthopedic follow-up [facility] orthopedic surgery consult  -Acute kidney injury Nephrotoxic agents are held IV fluids  -Hypertension Continue oral metoprolol  All the records are reviewed and case discussed with Care Management/Social Worker. Management plans discussed with the patient, family and they are in agreement.  CODE STATUS: DNR  DVT Prophylaxis: SCDs  TOTAL TIME TAKING CARE OF THIS PATIENT: 53 minutes.   POSSIBLE D/C IN  2 to 3 DAYS, DEPENDING ON CLINICAL CONDITION.  Ihor Austin M.D on 06/25/2018 at 10:09 AM  Between 7am to 6pm - Pager - [phone]  After 6pm go to [email or web address] - password [email or web address]  [facility]  Office  [phone]  CC: Primary care physician; Dorothey Baseman, MD  Note: This dictation was prepared with Dragon dictation along with smaller phrase technology. Any transcriptional errors that result from this process are unintentional.

## 2018-06-25 NOTE — Progress Notes (Signed)
Pt oriented to self only, alert to voice. Family at bedside updated on POC. Assessment and admission complete. In no acute distress at this time. Sleeping. Will continue to monitor

## 2018-06-25 NOTE — Progress Notes (Signed)
Pastoral Care Visit for HCPOA    06/25/18 1600  Clinical Encounter Type  Visited With Patient and family together  Visit Type Initial;Other (Comment) (HCPOA)  Referral From Nurse  Consult/Referral To Chaplain  Recommendations follow up Saturday  Stress Factors  Patient Stress Factors Health changes  Family Stress Factors Health changes;Loss of control   Chap visited w/ pt to complete HCPOA.  Pt was sitting up, talkative, coherent, and demonstrated understanding of the purpose of HCPOA and communicated her wishes.  Form was completed except for notary and signature of witnesses.  Upon entry of notary and witnesses the pt seemed disoriented and then was unable to express what the purpose of the documentation was.  Even with several questions/prompts the pt demonstrated that her cognition was not such that we could complete the HCPOA.  Mirna Mires assured son and granddaughter that it was not abnormal and that another chaplain could come back tomorrow to see if patient was able to complete.  Milinda Antis, [address]

## 2018-06-25 NOTE — Evaluation (Signed)
Physical Therapy Evaluation Patient Details Name: Leslie Huffman MRN: [id number] DOB: [birth year] Today's Date: 06/25/2018   History of Present Illness  85 y.o. female with PMH that includes HTN, MI, and vertigo who presented with left shoulder pain after a fall 3/9.  The patient stated that she was outside hanging clothes on a close line when she slipped on uneven ground.  Imaging revealed surgical neck fracture of the L humerus with nonoperative management recommended by orthopedic MD.  Pt dicharged home but returned to ED 06/22/18 with increased LUE pain and vomiting. She was eventually discharged to a rehab facility and was found to have fever and returns with sepsis, UTI.     Clinical Impression  Pt sleeping on arrival but granddaughter helped to wake her and convince her to work with PT.  Initially she was willing but reports that she really wants to nap.  Pt did reasonably well with LE strength testing but needed very heavy assist to get to/from sitting and essentially refused to try standing today after a feeble attempt with HW, raised bed and assist to shift forward.  Pt was able to stand with PT 2 days ago though she needed a lot of assist, g-daughter and PT encouragement could not convince her to participate further this date.      Follow Up Recommendations SNF    Equipment Recommendations  (likely will need hemiwalker)    Recommendations for Other Services       Precautions / Restrictions Precautions Precautions: Fall Restrictions LUE Weight Bearing: Non weight bearing      Mobility  Bed Mobility Overal bed mobility: Needs Assistance Bed Mobility: Supine to Sit;Sit to Supine     Supine to sit: Max assist Sit to supine: Max assist   General bed mobility comments: Near total assist for bed mobility tasks  Transfers                 General transfer comment: 1 feeble attempt to stand and pt ultimately refuses siting being too tired, too much pain (back and side more  than shoulder), her age and generally unwilling to try even with offers for considerable assist and that she did not have to try to walk  Ambulation/Gait                Stairs            Wheelchair Mobility    Modified Rankin (Stroke Patients Only)       Balance Overall balance assessment: Needs assistance Sitting-balance support: Single extremity supported Sitting balance-Leahy Scale: Fair Sitting balance - Comments: Pt initially leaning backward, but with assist to get to EOB needed only min assist most of the time                                     Pertinent Vitals/Pain Pain Assessment: 0-10 Pain Score: 3 (has anxiety about pain with basically all movement)    Home Living Family/patient expects to be discharged to:: Skilled nursing facility Living Arrangements: Alone Available Help at Discharge: Family;Available PRN/intermittently Type of Home: Independent living facility Home Access: Level entry     Home Layout: One level Home Equipment: Walker - 4 wheels      Prior Function Level of Independence: Independent with assistive device(s)         Comments: Mod Ind amb with a rollator ("slow and steady") no other falls in the  last 6 months, Ind with bathing and dressing and simple meal prep, facility prepares lunch     Hand Dominance        Extremity/Trunk Assessment   Upper Extremity Assessment Upper Extremity Assessment: Generalized weakness(L not tested (in immobilizer), R age appropriate limits)    Lower Extremity Assessment Lower Extremity Assessment: Generalized weakness       Communication   Communication: No difficulties  Cognition Arousal/Alertness: Awake/alert Behavior During Therapy: Anxious Overall Cognitive Status: History of cognitive impairments - at baseline(may be slighly more confused than baseline (UTI))                                        General Comments      Exercises      Assessment/Plan    PT Assessment Patient needs continued PT services  PT Problem List Decreased strength;Decreased activity tolerance;Decreased balance;Decreased mobility;Decreased knowledge of use of DME       PT Treatment Interventions DME instruction;Gait training;Functional mobility training;Therapeutic activities;Therapeutic exercise;Balance training;Patient/family education    PT Goals (Current goals can be found in the Care Plan section)  Acute Rehab PT Goals Patient Stated Goal: Increased mobility with decreased pain PT Goal Formulation: With patient/family Time For Goal Achievement: 07/09/18 Potential to Achieve Goals: Fair    Frequency Min 2X/week   Barriers to discharge        Co-evaluation               AM-PAC PT "6 Clicks" Mobility  Outcome Measure Help needed turning from your back to your side while in a flat bed without using bedrails?: Total Help needed moving from lying on your back to sitting on the side of a flat bed without using bedrails?: Total Help needed moving to and from a bed to a chair (including a wheelchair)?: Total Help needed standing up from a chair using your arms (e.g., wheelchair or bedside chair)?: Total Help needed to walk in hospital room?: Total Help needed climbing 3-5 steps with a railing? : Total 6 Click Score: 6    End of Session Equipment Utilized During Treatment: Gait belt;Oxygen Activity Tolerance: Patient limited by fatigue;Patient limited by pain Patient left: with call bell/phone within reach;with family/visitor present;with bed alarm set   PT Visit Diagnosis: Unsteadiness on feet (R26.81);Muscle weakness (generalized) (M62.81);Difficulty in walking, not elsewhere classified (R26.2)    Time: 8251-8984 PT Time Calculation (min) (ACUTE ONLY): 19 min   Charges:   PT Evaluation $PT Eval Low Complexity: 1 Low          Malachi Pro, DPT 06/25/2018, 5:20 PM

## 2018-06-25 NOTE — Progress Notes (Signed)
Pt bladder scanned after having little output over the day. Retaining >900 in bladder. Pt is unable to void on command and abdomen appears distended. MD notified. New order for foley placed.

## 2018-06-25 NOTE — NC FL2 (Signed)
[place] MEDICAID FL2 LEVEL OF CARE SCREENING TOOL     IDENTIFICATION  Patient Name: Leslie Huffman Birthdate: 1925-02-15 Sex: female Admission Date (Current Location): 06/24/2018  [place] and [place] Number:  Chiropodist and Address:  [facility], [address], [place], [place] [address]      Provider Number: [id number]  Attending Physician Name and Address:  Ihor Austin, MD  Relative Name and Phone Number:       Current Level of Care: Hospital Recommended Level of Care: Skilled Nursing Facility Prior Approval Number:    Date Approved/Denied:   PASRR Number: [id number]  Discharge Plan: SNF    Current Diagnoses: Patient Active Problem List   Diagnosis Date Noted  . Sepsis (HCC) 06/24/2018    Orientation RESPIRATION BLADDER Height & Weight     Self, Time, Situation, Place  Normal Continent Weight: 65.5 kg Height:  5' (152.4 cm)  BEHAVIORAL SYMPTOMS/MOOD NEUROLOGICAL BOWEL NUTRITION STATUS      Continent Diet  AMBULATORY STATUS COMMUNICATION OF NEEDS Skin   Extensive Assist Verbally Normal                       Personal Care Assistance Level of Assistance  Bathing, Dressing Bathing Assistance: Maximum assistance Feeding assistance: Limited assistance Dressing Assistance: Maximum assistance Total Care Assistance: Limited assistance   Functional Limitations Info             SPECIAL CARE FACTORS FREQUENCY  PT (By licensed PT), OT (By licensed OT)     PT Frequency: 5 times per week OT Frequency: 5 times per week            Contractures Contractures Info: Not present    Additional Factors Info  Code Status Code Status Info: DNR Allergies Info: Sulfa antibiotics           Current Medications (06/25/2018):  This is the current hospital active medication list Current Facility-Administered Medications  Medication Dose Route Frequency Provider Last Rate Last Dose  . 0.9 %  sodium chloride infusion    Intravenous Continuous Gouru, Aruna, MD 100 mL/hr at 06/25/18 1408    . acetaminophen (TYLENOL) tablet 1,000 mg  1,000 mg Oral Q6H PRN Gouru, Aruna, MD   1,000 mg at 06/25/18 0251  . aspirin EC tablet 81 mg  81 mg Oral Daily Gouru, Aruna, MD   81 mg at 06/25/18 0858  . [START ON 06/26/2018] cefTRIAXone (ROCEPHIN) 2 g in sodium chloride 0.9 % 100 mL IVPB  2 g Intravenous Q24H Pyreddy, Pavan, MD      . enoxaparin (LOVENOX) injection 30 mg  30 mg Subcutaneous Q24H Gouru, Aruna, MD   30 mg at 06/25/18 0017  . levothyroxine (SYNTHROID, LEVOTHROID) tablet 50 mcg  50 mcg Oral [id number] Ramonita Lab, MD   50 mcg at 06/25/18 0556  . MEDLINE mouth rinse  15 mL Mouth Rinse BID Gouru, Aruna, MD      . metoprolol succinate (TOPROL-XL) 24 hr tablet 50 mg  50 mg Oral Daily Gouru, Aruna, MD   50 mg at 06/25/18 0858  . ondansetron (ZOFRAN) tablet 4 mg  4 mg Oral Q6H PRN Gouru, Aruna, MD       Or  . ondansetron (ZOFRAN) injection 4 mg  4 mg Intravenous Q6H PRN Gouru, Aruna, MD      . oxyCODONE (Oxy IR/ROXICODONE) immediate release tablet 5 mg  5 mg Oral Q6H PRN Ramonita Lab, MD      .  polyethylene glycol (MIRALAX / GLYCOLAX) packet 17 g  17 g Oral Daily PRN Gouru, Aruna, MD   17 g at 06/25/18 0909  . traMADol (ULTRAM) tablet 50 mg  50 mg Oral Q12H PRN Ramonita Lab, MD   50 mg at 06/24/18 2042     Discharge Medications: Please see discharge summary for a list of discharge medications.  Relevant Imaging Results:  Relevant Lab Results:   Additional Information    Allayne Butcher, RN

## 2018-06-26 LAB — BASIC METABOLIC PANEL
Anion gap: 6 (ref 5–15)
BUN: 30 mg/dL — ABNORMAL HIGH (ref 8–23)
CO2: 27 mmol/L (ref 22–32)
Calcium: 8 mg/dL — ABNORMAL LOW (ref 8.9–10.3)
Chloride: 107 mmol/L (ref 98–111)
Creatinine, Ser: 1.06 mg/dL — ABNORMAL HIGH (ref 0.44–1.00)
GFR calc Af Amer: 53 mL/min — ABNORMAL LOW (ref >60)
GFR calc non Af Amer: 46 mL/min — ABNORMAL LOW (ref >60)
Glucose, Bld: 105 mg/dL — ABNORMAL HIGH (ref 70–99)
POTASSIUM: 3.7 mmol/L (ref 3.5–5.1)
Sodium: 140 mmol/L (ref 135–145)

## 2018-06-26 MED ORDER — TAMSULOSIN HCL 0.4 MG PO CAPS: 0.4000 mg | ORAL_CAPSULE | Freq: Every day | ORAL | 0 refills | Status: AC

## 2018-06-26 MED ORDER — TRAMADOL HCL 50 MG PO TABS: 50.0000 mg | ORAL_TABLET | Freq: Two times a day (BID) | ORAL | 0 refills | Status: AC | PRN

## 2018-06-26 MED ORDER — TAMSULOSIN HCL 0.4 MG PO CAPS
0.4000 mg | ORAL_CAPSULE | Freq: Every day | ORAL | Status: DC
  Administered 2018-06-26: 0.4 mg via ORAL
  Filled 2018-06-26: qty 1

## 2018-06-26 MED ORDER — CEPHALEXIN 250 MG PO CAPS: 250.0000 mg | ORAL_CAPSULE | Freq: Three times a day (TID) | ORAL | 0 refills | Status: AC

## 2018-06-26 NOTE — Progress Notes (Signed)
Report called to Amy at [facility], EMS called. Waiting on transport.

## 2018-06-26 NOTE — TOC Initial Note (Signed)
Transition of Care [facility]) - Initial/Assessment Note    Patient Details  Name: Leslie Huffman MRN: [id number] Date of Birth: 1924-12-04  [facility] CM/SW Contact:    Judi Cong, LCSW Phone Number: 06/26/2018, 12:37 PM  Clinical Narrative:   The CSW spoke with the patient's family to discuss discharge planning. The patient had originally discharged to [facility] from the [facility] ED; however, upon attempt at admission, [facility] transferred the patient back to [facility] due to AMS. In addition to her humerus fracture, the patient was found to have a UTI. The patient will discharge to [facility] today with oral Keflex and a foley catheter. The CSW has sent all discharge paperwork to the facility and will deliver the discharge packet as soon as possible. At that time, the CSW will sign off. Please consult should needs arise.                Expected Discharge Plan: Skilled Nursing Facility Barriers to Discharge: No Barriers Identified   Patient Goals and CMS Choice Patient states their goals for this hospitalization and ongoing recovery are:: "We want mom to be able to care for herself. We want to find out what is happening to make her AMS." CMS Medicare.gov Compare Post Acute Care list provided to:: Patient Choice offered to / list presented to : Patient, Adult Children  Expected Discharge Plan and Services Expected Discharge Plan: Skilled Nursing Facility Discharge Planning Services: NA Post Acute Care Choice: Skilled Nursing Facility Living arrangements for the past 2 months: Single Family Home Expected Discharge Date: 06/26/18               DME Arranged: N/A DME Agency: NA HH Arranged: NA HH Agency: NA  Prior Living Arrangements/Services Living arrangements for the past 2 months: Single Family Home Lives with:: Self Patient language and need for interpreter reviewed:: Yes Do you feel safe going back to the place where you live?: Yes      Need for Family  Participation in Patient Care: No (Comment) Care giver support system in place?: Yes (comment) Current home services: DME Criminal Activity/Legal Involvement Pertinent to Current Situation/Hospitalization: No - Comment as needed  Activities of Daily Living Home Assistive Devices/Equipment: Environmental consultant (specify type)(rollator) ADL Screening (condition at time of admission) Patient's cognitive ability adequate to safely complete daily activities?: Yes Is the patient deaf or have difficulty hearing?: No Does the patient have difficulty seeing, even when wearing glasses/contacts?: No Does the patient have difficulty concentrating, remembering, or making decisions?: Yes Patient able to express need for assistance with ADLs?: No Does the patient have difficulty dressing or bathing?: Yes Independently performs ADLs?: No Does the patient have difficulty walking or climbing stairs?: Yes Weakness of Legs: None Weakness of Arms/Hands: Left  Permission Sought/Granted Permission sought to share information with : Facility Medical sales representative, Family Supports Permission granted to share information with : Yes, Verbal Permission Granted     Permission granted to share info w AGENCY: SNF's  Permission granted to share info w Relationship: Son and granddaughter     Emotional Assessment Appearance:: Appears stated age Attitude/Demeanor/Rapport: Apprehensive Affect (typically observed): Stable Orientation: : Oriented to Self, Oriented to Place, Oriented to  Time, Oriented to Situation Alcohol / Substance Use: Never Used Psych Involvement: No (comment)  Admission diagnosis:  Fever, unspecified fever cause [R50.9] Urinary tract infection without hematuria, site unspecified [N39.0] Altered mental status, unspecified altered mental status type [R41.82] Patient Active Problem List  Diagnosis Date Noted  . Sepsis (HCC) 06/24/2018   PCP:  Dorothey Baseman, MD Pharmacy:   [facility] -  [place], [place] - [address] [place] [address] Phone: [phone] Fax: [phone]     Social Determinants of Health (SDOH) Interventions    Readmission Risk Interventions 30 Day Unplanned Readmission Risk Score     ED to Hosp-Admission (Current) from 06/24/2018 in [facility] (1A)  30 Day Unplanned Readmission Risk Score (%)  17 Filed at 06/26/2018 1200     This score is the patient's risk of an unplanned readmission within 30 days of being discharged (0 -100%). The score is based on dignosis, age, lab data, medications, orders, and past utilization.   Low:  0-14.9   Medium: 15-21.9   High: 22-29.9   Extreme: 30 and above       No flowsheet data found.

## 2018-06-26 NOTE — Progress Notes (Signed)
   06/26/18 0915  Clinical Encounter Type  Visited With Patient and family together  Visit Type Follow-up (advanced directive )  Referral From Chaplain   Chaplain responded to page regarding AD.  Chaplain reviewed document with patient.  Patient and son spoke of yesterday's engagement and impact of bladder issues on inability to complete.  Chaplain located witnesses and notary.  Per notary's suggestion, chaplain confirmed with unit charge nurse that visitors from other rooms could serve as Forensic scientist confirmed.  Document completed; original and copies given to patient and son while copy was placed in chart.  Chaplain utilized active and reflective listening as patient processed role of faith in God in her life.  Chaplain encouraged patient and son to reach out as needed.  Both expressed openness to ongoing chaplain support.

## 2018-06-26 NOTE — Discharge Summary (Signed)
[facility] -  at [facility]   PATIENT NAME: Leslie Huffman    MR#:  [id number]  DATE OF BIRTH:  09-07-1924  DATE OF ADMISSION:  06/24/2018 ADMITTING PHYSICIAN: Ramonita Lab, MD  DATE OF DISCHARGE: 06/26/2018  PRIMARY CARE PHYSICIAN: Dorothey Baseman, MD    ADMISSION DIAGNOSIS:  Fever, unspecified fever cause [R50.9] Urinary tract infection without hematuria, site unspecified [N39.0] Altered mental status, unspecified altered mental status type [R41.82]  DISCHARGE DIAGNOSIS:  Active Problems:   Sepsis (HCC)   SECONDARY DIAGNOSIS:   Past Medical History:  Diagnosis Date  . Heart attack (HCC)   . Hypertension   . Vertigo     HOSPITAL COURSE:  85 year old female with a history of CAD and hypertension with recent left humeral fracture after mechanical fall who presented to the hospital due to confusion.  1.  Acute metabolic encephalopathy due to UTI: Patient's mental status is at baseline.  2.  Sepsis from urinary tract infection: Patient presented with elevated white blood cell count and fever.  Patient will be discharged on oral Keflex.  Blood culture 1 out of 2 bottles were staph species versus contaminant.   3.  Recent left humeral fracture after mechanical fall: Patient will follow-up with Dr. Ernest Pine as per her wishes. She will need to continue with shoulder sling and PRN pain medications.  4.  Essential hypertension: Patient will continue metoprolol, lisinopril and Maxide  5.  Hypothyroidism: Continue Synthroid DISCHARGE CONDITIONS AND DIET:   Stable for discharge on regular diet  CONSULTS OBTAINED:    DRUG ALLERGIES:   Allergies  Allergen Reactions  . Sulfa Antibiotics Nausea And Vomiting, Nausea Only and Other (See Comments)    Altered mental status Reaction: unknown Altered mental status Altered mental status    DISCHARGE MEDICATIONS:   Allergies as of 06/26/2018      Reactions   Sulfa Antibiotics Nausea And Vomiting, Nausea  Only, Other (See Comments)   Altered mental status Reaction: unknown Altered mental status Altered mental status      Medication List    TAKE these medications   acetaminophen 500 MG tablet Commonly known as:  TYLENOL Take 1,000 mg by mouth every 6 (six) hours as needed for moderate pain.   aspirin EC 81 MG tablet Take 81 mg by mouth daily.   Calcium Carbonate-Vitamin D 600-400 MG-UNIT chew tablet Chew 1 tablet by mouth daily.   cephALEXin 250 MG capsule Commonly known as:  KEFLEX Take 1 capsule (250 mg total) by mouth 3 (three) times daily for 4 days.   levothyroxine 50 MCG tablet Commonly known as:  SYNTHROID, LEVOTHROID Take 50 mcg by mouth daily before breakfast.   lisinopril 20 MG tablet Commonly known as:  PRINIVIL,ZESTRIL Take 20 mg by mouth daily.   metoprolol succinate 50 MG 24 hr tablet Commonly known as:  TOPROL-XL Take 50 mg by mouth daily.   PreserVision AREDS Tabs Take 1 tablet by mouth daily.   tamsulosin 0.4 MG Caps capsule Commonly known as:  FLOMAX Take 1 capsule (0.4 mg total) by mouth daily. Start taking on:  June 27, 2018   traMADol 50 MG tablet Commonly known as:  Ultram Take 1 tablet (50 mg total) by mouth every 12 (twelve) hours as needed for up to 7 days for severe pain.   triamterene-hydrochlorothiazide 37.5-25 MG tablet Commonly known as:  MAXZIDE-25 Take 0.5 tablets by mouth daily.         Today   CHIEF COMPLAINT:  Patient is doing  well this morning.  Son is at bedside.  No acute issues overnight.   VITAL SIGNS:  Blood pressure (!) 165/93, pulse 88, temperature 98 F (36.7 C), temperature source Oral, resp. rate (!) 6, height 5' (1.524 m), weight 65.5 kg, SpO2 91 %.   REVIEW OF SYSTEMS:  Review of Systems  Constitutional: Negative.  Negative for chills, fever and malaise/fatigue.  HENT: Negative.  Negative for ear discharge, ear pain, hearing loss, nosebleeds and sore throat.   Eyes: Negative.  Negative for blurred  vision and pain.  Respiratory: Negative.  Negative for cough, hemoptysis, shortness of breath and wheezing.   Cardiovascular: Negative.  Negative for chest pain, palpitations and leg swelling.  Gastrointestinal: Negative.  Negative for abdominal pain, blood in stool, diarrhea, nausea and vomiting.  Genitourinary: Negative.  Negative for dysuria, flank pain, frequency, hematuria and urgency.  Musculoskeletal: Positive for joint pain. Negative for back pain.  Skin: Negative.   Neurological: Negative for dizziness, tremors, speech change, focal weakness, seizures and headaches.  Endo/Heme/Allergies: Negative.  Does not bruise/bleed easily.  Psychiatric/Behavioral: Negative.  Negative for depression, hallucinations and suicidal ideas.     PHYSICAL EXAMINATION:  GENERAL:  85 y.o.-year-old patient lying in the bed with no acute distress.  NECK:  Supple, no jugular venous distention. No thyroid enlargement, no tenderness.  LUNGS: Normal breath sounds bilaterally, no wheezing, rales,rhonchi  No use of accessory muscles of respiration.  CARDIOVASCULAR: S1, S2 normal. No murmurs, rubs, or gallops.  ABDOMEN: Soft, non-tender, non-distended. Bowel sounds present. No organomegaly or mass.  EXTREMITIES: No pedal edema, cyanosis, or clubbing.  PSYCHIATRIC: The patient is alert and oriented x 3.  SKIN: No obvious rash, lesion, or ulcer.  Left shoulder is in sling DATA REVIEW:   CBC Recent Labs  Lab 06/25/18 0208  WBC 7.7  HGB 9.4*  HCT 29.6*  PLT 172    Chemistries  Recent Labs  Lab 06/25/18 0208 06/26/18 0400  NA 138 140  K 4.0 3.7  CL 101 107  CO2 29 27  GLUCOSE 128* 105*  BUN 29* 30*  CREATININE 1.12* 1.06*  CALCIUM 8.4* 8.0*  AST 34  --   ALT 45*  --   ALKPHOS 77  --   BILITOT 0.7  --     Cardiac Enzymes No results for input(s): TROPONINI in the last 168 hours.  Microbiology Results  @[email or web address] @  RADIOLOGY:  Dg Chest Port 1 View  Result Date: 06/24/2018 CLINICAL  DATA:  Pt was discharge from [facility] hospital today for left broken shoulder. Per nursing facility staff, patient was AMS at time of arrival from hospital with low oxygen saturation EXAM: PORTABLE CHEST 1 VIEW COMPARISON:  07/07/2014 FINDINGS: Cardiac silhouette is normal in size. No mediastinal or hilar masses. There is lung base opacity consistent with atelectasis. Remainder of the lungs is clear. Lung volumes are low. Partly imaged left proximal humerus fracture is without significant change from the shoulder radiographs dated 06/21/2018. Chronic changes from prior right clavicle ORIF. Skeletal structures are demineralized. IMPRESSION: 1. No acute cardiopulmonary disease. 2. Low lung volumes with lung base atelectasis. Electronically Signed   By: Amie Portland M.D.   On: 06/24/2018 18:46      Allergies as of 06/26/2018      Reactions   Sulfa Antibiotics Nausea And Vomiting, Nausea Only, Other (See Comments)   Altered mental status Reaction: unknown Altered mental status Altered mental status      Medication List    TAKE these medications  acetaminophen 500 MG tablet Commonly known as:  TYLENOL Take 1,000 mg by mouth every 6 (six) hours as needed for moderate pain.   aspirin EC 81 MG tablet Take 81 mg by mouth daily.   Calcium Carbonate-Vitamin D 600-400 MG-UNIT chew tablet Chew 1 tablet by mouth daily.   cephALEXin 250 MG capsule Commonly known as:  KEFLEX Take 1 capsule (250 mg total) by mouth 3 (three) times daily for 4 days.   levothyroxine 50 MCG tablet Commonly known as:  SYNTHROID, LEVOTHROID Take 50 mcg by mouth daily before breakfast.   lisinopril 20 MG tablet Commonly known as:  PRINIVIL,ZESTRIL Take 20 mg by mouth daily.   metoprolol succinate 50 MG 24 hr tablet Commonly known as:  TOPROL-XL Take 50 mg by mouth daily.   PreserVision AREDS Tabs Take 1 tablet by mouth daily.   tamsulosin 0.4 MG Caps capsule Commonly known as:  FLOMAX Take 1 capsule (0.4 mg  total) by mouth daily. Start taking on:  June 27, 2018   traMADol 50 MG tablet Commonly known as:  Ultram Take 1 tablet (50 mg total) by mouth every 12 (twelve) hours as needed for up to 7 days for severe pain.   triamterene-hydrochlorothiazide 37.5-25 MG tablet Commonly known as:  MAXZIDE-25 Take 0.5 tablets by mouth daily.         Management plans discussed with the patient and she is in agreement. Stable for discharge   Patient should follow up with pcp and ortho  CODE STATUS:     Code Status Orders  (From admission, onward)         Start     Ordered   06/24/18 2258  Do not attempt resuscitation (DNR)  Continuous    Question Answer Comment  In the event of cardiac or respiratory ARREST Do not call a "code blue"   In the event of cardiac or respiratory ARREST Do not perform Intubation, CPR, defibrillation or ACLS   In the event of cardiac or respiratory ARREST Use medication by any route, position, wound care, and other measures to relive pain and suffering. May use oxygen, suction and manual treatment of airway obstruction as needed for comfort.   Comments RN may pronounce      06/24/18 2257        Code Status History    This patient has a current code status but no historical code status.      TOTAL TIME TAKING CARE OF THIS PATIENT: 38 minutes.    Note: This dictation was prepared with Dragon dictation along with smaller phrase technology. Any transcriptional errors that result from this process are unintentional.  Jaretzi Droz M.D on 06/26/2018 at 9:50 AM  Between 7am to 6pm - Pager - [phone] After 6pm go to [email or web address] - Social research officer, government  [facility]  Office  [phone]  CC: Primary care physician; Dorothey Baseman, MD

## 2018-06-26 NOTE — [place] FL2 (Signed)
[place] MEDICAID FL2 LEVEL OF CARE SCREENING TOOL     IDENTIFICATION  Patient Name: Leslie Huffman Birthdate: 1925-02-12 Sex: female Admission Date (Current Location): 06/24/2018  [place] and [place] Number:  Chiropodist and Address:  [facility], [address] [place], [place] [address]      Provider Number: [id number]  Attending Physician Name and Address:  Adrian Saran, MD  Relative Name and Phone Number:  Lillyn Bracher [address] [facility]) [phone]    Current Level of Care: Hospital Recommended Level of Care: Skilled Nursing Facility Prior Approval Number:    Date Approved/Denied:   PASRR Number: [id number]  Discharge Plan: SNF    Current Diagnoses: Patient Active Problem List   Diagnosis Date Noted  . Sepsis (HCC) 06/24/2018    Orientation RESPIRATION BLADDER Height & Weight     Self, Time, Situation, Place  Normal Indwelling catheter Weight: 144 lb 6.4 oz (65.5 kg) Height:  5' (152.4 cm)  BEHAVIORAL SYMPTOMS/MOOD NEUROLOGICAL BOWEL NUTRITION STATUS      Incontinent Diet(2 gram sodium)  AMBULATORY STATUS COMMUNICATION OF NEEDS Skin   Extensive Assist Verbally Bruising                       Personal Care Assistance Level of Assistance  Bathing, Feeding, Dressing Bathing Assistance: Maximum assistance Feeding assistance: Limited assistance Dressing Assistance: Maximum assistance Total Care Assistance: Limited assistance   Functional Limitations Info  Sight, Hearing, Speech Sight Info: Impaired Hearing Info: Impaired Speech Info: Adequate    SPECIAL CARE FACTORS FREQUENCY  PT (By licensed PT), OT (By licensed OT)     PT Frequency: 5X OT Frequency: 3X            Contractures Contractures Info: Not present    Additional Factors Info  Code Status, Allergies Code Status Info: DNR Allergies Info: Sulfa Antibiotics           Current Medications (06/26/2018):  This is the current hospital active medication  list Current Facility-Administered Medications  Medication Dose Route Frequency Provider Last Rate Last Dose  . acetaminophen (TYLENOL) tablet 1,000 mg  1,000 mg Oral Q6H PRN Gouru, Aruna, MD   1,000 mg at 06/25/18 1754  . aspirin EC tablet 81 mg  81 mg Oral Daily Gouru, Aruna, MD   81 mg at 06/26/18 0940  . cefTRIAXone (ROCEPHIN) 2 g in sodium chloride 0.9 % 100 mL IVPB  2 g Intravenous Q24H Ihor Austin, MD   Stopped at 06/25/18 2351  . enoxaparin (LOVENOX) injection 30 mg  30 mg Subcutaneous Q24H Gouru, Aruna, MD   30 mg at 06/25/18 2316  . levothyroxine (SYNTHROID, LEVOTHROID) tablet 50 mcg  50 mcg Oral Q0600 Ramonita Lab, MD   50 mcg at 06/26/18 0405  . MEDLINE mouth rinse  15 mL Mouth Rinse BID Gouru, Aruna, MD      . metoprolol succinate (TOPROL-XL) 24 hr tablet 50 mg  50 mg Oral Daily Gouru, Aruna, MD   50 mg at 06/26/18 0940  . ondansetron (ZOFRAN) tablet 4 mg  4 mg Oral Q6H PRN Gouru, Aruna, MD       Or  . ondansetron (ZOFRAN) injection 4 mg  4 mg Intravenous Q6H PRN Gouru, Aruna, MD      . oxyCODONE (Oxy IR/ROXICODONE) immediate release tablet 5 mg  5 mg Oral Q6H PRN Gouru, Aruna, MD      . polyethylene glycol (MIRALAX / GLYCOLAX) packet 17 g  17 g Oral Daily  PRN Ramonita Lab, MD   17 g at 06/25/18 0909  . tamsulosin (FLOMAX) capsule 0.4 mg  0.4 mg Oral Daily Adrian Saran, MD   0.4 mg at 06/26/18 0940  . traMADol (ULTRAM) tablet 50 mg  50 mg Oral Q12H PRN Ramonita Lab, MD   50 mg at 06/26/18 5638     Discharge Medications: Please see discharge summary for a list of discharge medications.  Relevant Imaging Results:  Relevant Lab Results:   Additional Information SS#[id number]  Judi Cong, LCSW

## 2018-06-27 LAB — CULTURE, BLOOD (ROUTINE X 2)

## 2018-06-27 LAB — URINE CULTURE: Culture: 80000 — AB

## 2018-06-29 LAB — CULTURE, BLOOD (ROUTINE X 2): Culture: NO GROWTH

## 2018-08-19 ENCOUNTER — Ambulatory Visit: Payer: Medicare Other | Admitting: Podiatry

## 2018-08-26 ENCOUNTER — Emergency Department: Payer: Medicare Other

## 2018-08-26 ENCOUNTER — Inpatient Hospital Stay: Admission: AD | Admit: 2018-08-26 | Payer: Medicare Other | Admitting: Internal Medicine

## 2018-08-26 ENCOUNTER — Inpatient Hospital Stay: Payer: Medicare Other

## 2018-08-26 ENCOUNTER — Inpatient Hospital Stay (HOSPITAL_COMMUNITY)
Admission: AD | Admit: 2018-08-26 | Discharge: 2018-09-13 | DRG: 871 | Disposition: E | Payer: Medicare Other | Source: Other Acute Inpatient Hospital | Attending: Internal Medicine | Admitting: Internal Medicine

## 2018-08-26 ENCOUNTER — Inpatient Hospital Stay
Admission: EM | Admit: 2018-08-26 | Discharge: 2018-08-26 | DRG: 871 | Disposition: A | Discharge status: Other Acute Inpatient Hospital | Payer: Medicare Other | Attending: Internal Medicine | Admitting: Internal Medicine

## 2018-08-26 ENCOUNTER — Other Ambulatory Visit: Payer: Self-pay

## 2018-08-26 DIAGNOSIS — U071 COVID-19: Secondary | ICD-10-CM | POA: Diagnosis present

## 2018-08-26 DIAGNOSIS — Z66 Do not resuscitate: Secondary | ICD-10-CM | POA: Diagnosis present

## 2018-08-26 DIAGNOSIS — B9689 Other specified bacterial agents as the cause of diseases classified elsewhere: Secondary | ICD-10-CM | POA: Diagnosis present

## 2018-08-26 DIAGNOSIS — E872 Acidosis: Secondary | ICD-10-CM | POA: Diagnosis present

## 2018-08-26 DIAGNOSIS — N183 Chronic kidney disease, stage 3 unspecified: Secondary | ICD-10-CM | POA: Diagnosis present

## 2018-08-26 DIAGNOSIS — G9341 Metabolic encephalopathy: Secondary | ICD-10-CM | POA: Diagnosis present

## 2018-08-26 DIAGNOSIS — I248 Other forms of acute ischemic heart disease: Secondary | ICD-10-CM | POA: Diagnosis present

## 2018-08-26 DIAGNOSIS — A419 Sepsis, unspecified organism: Secondary | ICD-10-CM | POA: Diagnosis present

## 2018-08-26 DIAGNOSIS — R778 Other specified abnormalities of plasma proteins: Secondary | ICD-10-CM | POA: Diagnosis present

## 2018-08-26 DIAGNOSIS — Z515 Encounter for palliative care: Secondary | ICD-10-CM | POA: Diagnosis not present

## 2018-08-26 DIAGNOSIS — N390 Urinary tract infection, site not specified: Secondary | ICD-10-CM

## 2018-08-26 DIAGNOSIS — A4189 Other specified sepsis: Secondary | ICD-10-CM | POA: Diagnosis present

## 2018-08-26 DIAGNOSIS — E860 Dehydration: Secondary | ICD-10-CM | POA: Diagnosis present

## 2018-08-26 DIAGNOSIS — E875 Hyperkalemia: Secondary | ICD-10-CM | POA: Diagnosis not present

## 2018-08-26 DIAGNOSIS — R4182 Altered mental status, unspecified: Secondary | ICD-10-CM

## 2018-08-26 DIAGNOSIS — Z7982 Long term (current) use of aspirin: Secondary | ICD-10-CM

## 2018-08-26 DIAGNOSIS — N179 Acute kidney failure, unspecified: Secondary | ICD-10-CM | POA: Diagnosis present

## 2018-08-26 DIAGNOSIS — E039 Hypothyroidism, unspecified: Secondary | ICD-10-CM | POA: Diagnosis present

## 2018-08-26 DIAGNOSIS — I10 Essential (primary) hypertension: Secondary | ICD-10-CM | POA: Diagnosis present

## 2018-08-26 DIAGNOSIS — R6521 Severe sepsis with septic shock: Secondary | ICD-10-CM | POA: Diagnosis present

## 2018-08-26 DIAGNOSIS — D7589 Other specified diseases of blood and blood-forming organs: Secondary | ICD-10-CM | POA: Diagnosis not present

## 2018-08-26 DIAGNOSIS — R739 Hyperglycemia, unspecified: Secondary | ICD-10-CM | POA: Diagnosis not present

## 2018-08-26 DIAGNOSIS — I252 Old myocardial infarction: Secondary | ICD-10-CM | POA: Diagnosis not present

## 2018-08-26 DIAGNOSIS — R410 Disorientation, unspecified: Secondary | ICD-10-CM | POA: Diagnosis present

## 2018-08-26 DIAGNOSIS — I129 Hypertensive chronic kidney disease with stage 1 through stage 4 chronic kidney disease, or unspecified chronic kidney disease: Secondary | ICD-10-CM | POA: Diagnosis present

## 2018-08-26 DIAGNOSIS — Z79890 Hormone replacement therapy (postmenopausal): Secondary | ICD-10-CM | POA: Diagnosis not present

## 2018-08-26 DIAGNOSIS — R6520 Severe sepsis without septic shock: Secondary | ICD-10-CM | POA: Diagnosis not present

## 2018-08-26 DIAGNOSIS — Z79899 Other long term (current) drug therapy: Secondary | ICD-10-CM

## 2018-08-26 DIAGNOSIS — I2510 Atherosclerotic heart disease of native coronary artery without angina pectoris: Secondary | ICD-10-CM | POA: Diagnosis present

## 2018-08-26 DIAGNOSIS — R319 Hematuria, unspecified: Secondary | ICD-10-CM

## 2018-08-26 DIAGNOSIS — Z882 Allergy status to sulfonamides status: Secondary | ICD-10-CM

## 2018-08-26 DIAGNOSIS — E870 Hyperosmolality and hypernatremia: Secondary | ICD-10-CM | POA: Diagnosis present

## 2018-08-26 DIAGNOSIS — N170 Acute kidney failure with tubular necrosis: Secondary | ICD-10-CM | POA: Diagnosis present

## 2018-08-26 DIAGNOSIS — N10 Acute tubulo-interstitial nephritis: Secondary | ICD-10-CM | POA: Diagnosis present

## 2018-08-26 DIAGNOSIS — R7989 Other specified abnormal findings of blood chemistry: Secondary | ICD-10-CM | POA: Diagnosis present

## 2018-08-26 LAB — URINALYSIS, COMPLETE (UACMP) WITH MICROSCOPIC
Bilirubin Urine: NEGATIVE
Glucose, UA: NEGATIVE mg/dL
Hgb urine dipstick: NEGATIVE
Ketones, ur: NEGATIVE mg/dL
Nitrite: POSITIVE — AB
Protein, ur: NEGATIVE mg/dL
Specific Gravity, Urine: 1.014 (ref 1.005–1.030)
pH: 5 (ref 5.0–8.0)

## 2018-08-26 LAB — CBC WITH DIFFERENTIAL/PLATELET
Abs Immature Granulocytes: 0.1 10*3/uL — ABNORMAL HIGH (ref 0.00–0.07)
Basophils Absolute: 0 10*3/uL (ref 0.0–0.1)
Basophils Relative: 0 %
Eosinophils Absolute: 0 10*3/uL (ref 0.0–0.5)
Eosinophils Relative: 0 %
HCT: 34.5 % — ABNORMAL LOW (ref 36.0–46.0)
Hemoglobin: 10.8 g/dL — ABNORMAL LOW (ref 12.0–15.0)
Immature Granulocytes: 2 %
Lymphocytes Relative: 14 %
Lymphs Abs: 0.9 10*3/uL (ref 0.7–4.0)
MCH: 31.8 pg (ref 26.0–34.0)
MCHC: 31.3 g/dL (ref 30.0–36.0)
MCV: 101.5 fL — ABNORMAL HIGH (ref 80.0–100.0)
Monocytes Absolute: 0.3 10*3/uL (ref 0.1–1.0)
Monocytes Relative: 5 %
Neutro Abs: 5.5 10*3/uL (ref 1.7–7.7)
Neutrophils Relative %: 79 %
Platelets: 208 10*3/uL (ref 150–400)
RBC: 3.4 MIL/uL — ABNORMAL LOW (ref 3.87–5.11)
RDW: 14.8 % (ref 11.5–15.5)
WBC: 6.9 10*3/uL (ref 4.0–10.5)
nRBC: 0 % (ref 0.0–0.2)

## 2018-08-26 LAB — COMPREHENSIVE METABOLIC PANEL
ALT: 13 U/L (ref 0–44)
AST: 25 U/L (ref 15–41)
Albumin: 3.7 g/dL (ref 3.5–5.0)
Alkaline Phosphatase: 55 U/L (ref 38–126)
Anion gap: 13 (ref 5–15)
BUN: 142 mg/dL — ABNORMAL HIGH (ref 8–23)
CO2: 22 mmol/L (ref 22–32)
Calcium: 9.3 mg/dL (ref 8.9–10.3)
Chloride: 118 mmol/L — ABNORMAL HIGH (ref 98–111)
Creatinine, Ser: 4.47 mg/dL — ABNORMAL HIGH (ref 0.44–1.00)
GFR calc Af Amer: 9 mL/min — ABNORMAL LOW (ref >60)
GFR calc non Af Amer: 8 mL/min — ABNORMAL LOW (ref >60)
Glucose, Bld: 177 mg/dL — ABNORMAL HIGH (ref 70–99)
Potassium: 5.3 mmol/L — ABNORMAL HIGH (ref 3.5–5.1)
Sodium: 153 mmol/L — ABNORMAL HIGH (ref 135–145)
Total Bilirubin: 0.7 mg/dL (ref 0.3–1.2)
Total Protein: 7 g/dL (ref 6.5–8.1)

## 2018-08-26 LAB — BASIC METABOLIC PANEL
Anion gap: 8 (ref 5–15)
BUN: 129 mg/dL — ABNORMAL HIGH (ref 8–23)
CO2: 22 mmol/L (ref 22–32)
Calcium: 8.6 mg/dL — ABNORMAL LOW (ref 8.9–10.3)
Chloride: 126 mmol/L — ABNORMAL HIGH (ref 98–111)
Creatinine, Ser: 3.52 mg/dL — ABNORMAL HIGH (ref 0.44–1.00)
GFR calc Af Amer: 12 mL/min — ABNORMAL LOW (ref >60)
GFR calc non Af Amer: 11 mL/min — ABNORMAL LOW (ref >60)
Glucose, Bld: 138 mg/dL — ABNORMAL HIGH (ref 70–99)
Potassium: 5.2 mmol/L — ABNORMAL HIGH (ref 3.5–5.1)
Sodium: 156 mmol/L — ABNORMAL HIGH (ref 135–145)

## 2018-08-26 LAB — LACTATE DEHYDROGENASE: LDH: 172 U/L (ref 98–192)

## 2018-08-26 LAB — LACTIC ACID, PLASMA
Lactic Acid, Venous: 2.6 mmol/L (ref 0.5–1.9)
Lactic Acid, Venous: 1.8 mmol/L (ref 0.5–1.9)
Lactic Acid, Venous: 1.2 mmol/L (ref 0.5–1.9)

## 2018-08-26 LAB — SARS CORONAVIRUS 2 BY RT PCR (HOSPITAL ORDER, PERFORMED IN ~~LOC~~ HOSPITAL LAB)
SARS Coronavirus 2: NEGATIVE
SARS Coronavirus 2: POSITIVE — AB

## 2018-08-26 LAB — BRAIN NATRIURETIC PEPTIDE: B Natriuretic Peptide: 170 pg/mL — ABNORMAL HIGH (ref 0.0–100.0)

## 2018-08-26 LAB — PROCALCITONIN: Procalcitonin: <0.1 ng/mL

## 2018-08-26 LAB — TROPONIN I: Troponin I: 0.14 ng/mL

## 2018-08-26 LAB — SALICYLATE LEVEL: Salicylate Lvl: <7 mg/dL (ref 2.8–30.0)

## 2018-08-26 MED ORDER — ACETAMINOPHEN 650 MG RE SUPP
650.0000 mg | Freq: Once | RECTAL | Status: AC
  Administered 2018-08-26: 650 mg via RECTAL
  Filled 2018-08-26: qty 1

## 2018-08-26 MED ORDER — POLYETHYLENE GLYCOL 3350 17 G PO PACK: 17.0000 g | PACK | Freq: Every day | ORAL | Status: DC | PRN

## 2018-08-26 MED ORDER — ONDANSETRON HCL 4 MG/2ML IJ SOLN: 4.0000 mg | Freq: Four times a day (QID) | INTRAMUSCULAR | Status: DC | PRN

## 2018-08-26 MED ORDER — INSULIN ASPART 100 UNIT/ML ~~LOC~~ SOLN: 0.0000 [IU] | Freq: Three times a day (TID) | SUBCUTANEOUS | Status: DC

## 2018-08-26 MED ORDER — INSULIN ASPART 100 UNIT/ML ~~LOC~~ SOLN: 0.0000 [IU] | Freq: Every day | SUBCUTANEOUS | Status: DC

## 2018-08-26 MED ORDER — SODIUM CHLORIDE 0.45 % IV SOLN
INTRAVENOUS | Status: DC
  Administered 2018-08-26: 20:00:00 via INTRAVENOUS

## 2018-08-26 MED ORDER — SODIUM CHLORIDE 0.9 % IV BOLUS
1000.0000 mL | Freq: Once | INTRAVENOUS | Status: AC
  Administered 2018-08-26: 1000 mL via INTRAVENOUS

## 2018-08-26 MED ORDER — ACETAMINOPHEN 650 MG RE SUPP
650.0000 mg | Freq: Once | RECTAL | Status: AC
  Administered 2018-08-26: 650 mg via RECTAL

## 2018-08-26 MED ORDER — ACETAMINOPHEN 500 MG PO TABS: 1000.0000 mg | ORAL_TABLET | Freq: Once | ORAL | Status: DC

## 2018-08-26 MED ORDER — SODIUM CHLORIDE 0.9 % IV SOLN
1.0000 g | Freq: Once | INTRAVENOUS | Status: AC
  Administered 2018-08-26: 13:00:00 1 g via INTRAVENOUS

## 2018-08-26 MED ORDER — ONDANSETRON HCL 4 MG PO TABS: 4.0000 mg | ORAL_TABLET | Freq: Four times a day (QID) | ORAL | Status: DC | PRN

## 2018-08-26 NOTE — ED Notes (Signed)
[facility] here to transfer patient

## 2018-08-26 NOTE — ED Provider Notes (Signed)
Repeat COVID swab was positive, requiring transfer to [facility].  Repeat BMP showing very minimal changes in the creatinine, patient is making urine with a total urine output of 400 cc so far.  I spoke with Dr. Signe Colt, nephrology at [facility] who said the patient is a very poor candidate for dialysis and since she is making urine the patient will be a good candidate to be transferred to [facility].  I then discussed with Dr. Yetta Numbers, hospitalist who has accepted patient's transfer. Pending bed assignment.  I spoke with patient's son, Mr. Taeler Almonor over the phone.  He is the patient's healthcare power of attorney.  He confirms the patient is DNR/DNI.  He is okay with hydration and antibiotics but does not want patient to receive a central line.  He also would like to be contacted if patient starts to deteriorate as they may consider withdrawing care.  At this time will continue with hydration and antibiotics.  Patient is awaiting bed assignment.   Don Perking, [place], MD Sep 16, 2018 2000

## 2018-08-26 NOTE — Consult Note (Signed)
[facility] [place] at [facility]   PATIENT NAME: Leslie ProwsMary Huffman    MR#:  [id number]  DATE OF BIRTH:  1925/03/27  DATE OF ADMISSION:  19-Nov-2018  PRIMARY CARE PHYSICIAN: Dorothey BasemanBronstein, David, MD   REQUESTING/REFERRING PHYSICIAN: Dr. Dorothea GlassmanPaul Malinda  CHIEF COMPLAINT:   Chief Complaint  Patient presents with  . Altered Mental Status    HISTORY OF PRESENT ILLNESS:  Leslie Huffman  is a 85 y.o. female with a known history of hypertension, CAD, left humeral fracture after mechanical fall and sepsis in March 2020 is [facility] for altered mental status. Patient is lethargic and unable to provide any history.  Most of the history is obtained from talking to the son and previous notes.  Apparently patient was noted to be dehydrated and confused for the last couple of days.  She is also hypotensive today and so brought into the emergency room.  [facility] a nursing home where there has been an outbreak for SARS-CoV-2 infection recently.  According to the son patient was tested last week and was reportedly negative, however in the emergency room I was notified by the RN that patient was tested positive.  Her COVID-19 test today is negative.  She seems to be having a dry cough.  She is hypotensive in spite of 1 L of IV fluid bolus. Labs indicate severe UTI, elevated lactic acid and acute renal failure with potassium greater than 5 and creatinine greater than 4.  PAST MEDICAL HISTORY:   Past Medical History:  Diagnosis Date  . Heart attack (HCC)   . Hypertension   . Vertigo     PAST SURGICAL HISTOIRY:   Past Surgical History:  Procedure Laterality Date  . HIP SURGERY      SOCIAL HISTORY:   Social History   Tobacco Use  . Smoking status: Never Smoker  . Smokeless tobacco: Never Used  Substance Use Topics  . Alcohol use: No    FAMILY HISTORY:  History reviewed. No pertinent family history.  DRUG ALLERGIES:   Allergies   Allergen Reactions  . Sulfa Antibiotics Nausea And Vomiting, Nausea Only and Other (See Comments)    Altered mental status Reaction: unknown Altered mental status Altered mental status    REVIEW OF SYSTEMS:   Review of Systems  Unable to perform ROS: Mental status change    MEDICATIONS AT HOME:   Prior to Admission medications   Medication Sig Start Date End Date Taking? Authorizing Provider  acetaminophen (TYLENOL) 500 MG tablet Take 1,000 mg by mouth every 6 (six) hours as needed for moderate pain.   Yes [provider]  aspirin EC 81 MG tablet Take 81 mg by mouth daily.    Yes [provider]  Calcium Carbonate-Vitamin D 600-400 MG-UNIT chew tablet Chew 1 tablet by mouth daily.   Yes [provider]  levothyroxine (SYNTHROID, LEVOTHROID) 50 MCG tablet Take 50 mcg by mouth daily before breakfast.  11/26/17  Yes [provider]  lisinopril (PRINIVIL,ZESTRIL) 20 MG tablet Take 20 mg by mouth daily.  02/02/15  Yes [provider]  metoprolol succinate (TOPROL-XL) 50 MG 24 hr tablet Take 50 mg by mouth daily.    Yes [provider]  Multiple Vitamins-Minerals (PRESERVISION AREDS) TABS Take 1 tablet by mouth daily.   Yes [provider]  tamsulosin (FLOMAX) 0.4 MG CAPS capsule Take 1 capsule (0.4 mg total) by mouth daily. 06/27/18  Yes Mody, Sital,  MD  triamterene-hydrochlorothiazide (MAXZIDE-25) 37.5-25 MG tablet Take 1 tablet by mouth daily.  02/02/15  Yes [provider]      VITAL SIGNS:  Blood pressure 97/69, pulse 85, temperature 98.4 F (36.9 C), temperature source Axillary, resp. rate 17, height 5\' 1"  (1.549 m), weight 59 kg, SpO2 95 %.  PHYSICAL EXAMINATION:   Physical Exam  GENERAL:  85 y.o.-year-old elderly patient lying in the bed, not responding.  Critically ill-appearing EYES: Pupils equal, round, reactive to light and accommodation. No scleral icterus. Extraocular muscles intact.  HEENT: Head  atraumatic, normocephalic. Oropharynx and nasopharynx clear.  NECK:  Supple, no jugular venous distention. No thyroid enlargement, no tenderness.  LUNGS: Normal breath sounds bilaterally, no wheezing, rales,rhonchi or crepitation. No use of accessory muscles of respiration.  Decreased bibasilar breath sounds CARDIOVASCULAR: S1, S2 normal. No  rubs, or gallops.  2/6 systolic murmur is present ABDOMEN: Soft, nontender, nondistended. Bowel sounds present. No organomegaly or mass.  EXTREMITIES: No pedal edema, cyanosis, or clubbing.  NEUROLOGIC: Unable to follow simple commands, lethargic.  Withdrawing to pain.  PSYCHIATRIC: The patient is lethargic SKIN: No obvious rash, lesion, or ulcer.   LABORATORY PANEL:   CBC Recent Labs  Lab 09/07/2018 1119  WBC 6.9  HGB 10.8*  HCT 34.5*  PLT 208   ------------------------------------------------------------------------------------------------------------------  Chemistries  Recent Labs  Lab 08/14/2018 1119  NA 153*  K 5.3*  CL 118*  CO2 22  GLUCOSE 177*  BUN 142*  CREATININE 4.47*  CALCIUM 9.3  AST 25  ALT 13  ALKPHOS 55  BILITOT 0.7   ------------------------------------------------------------------------------------------------------------------  Cardiac Enzymes No results for input(s): TROPONINI in the last 168 hours. ------------------------------------------------------------------------------------------------------------------  RADIOLOGY:  Ct Head Wo Contrast  Result Date: 09/08/2018 CLINICAL DATA:  Altered mental status. EXAM: CT HEAD WITHOUT CONTRAST TECHNIQUE: Contiguous axial images were obtained from the base of the skull through the vertex without intravenous contrast. COMPARISON:  CT head dated November 24, 2017. FINDINGS: Brain: No evidence of acute infarction, hemorrhage, hydrocephalus, extra-axial collection or mass lesion/mass effect. Stable atrophy and chronic microvascular ischemic changes. Vascular: Calcified  atherosclerosis at the skullbase. No hyperdense vessel. Skull: Normal. Negative for fracture or focal lesion. Sinuses/Orbits: No acute finding. Other: None. IMPRESSION: 1.  No acute intracranial abnormality. Electronically Signed   By: Obie Dredge M.D.   On: 08/28/2018 12:31   Dg Chest Portable 1 View  Result Date: 08/18/2018 CLINICAL DATA:  Altered mental status EXAM: PORTABLE CHEST 1 VIEW COMPARISON:  06/24/2018 FINDINGS: Heart size is within normal limits. Negative for heart failure or pneumonia. Lungs are well aerated and clear Chronic fracture left humeral neck. Surgical plate fixation of right clavicle. Moderate dextroscoliosis. IMPRESSION: No active disease. Electronically Signed   By: Marlan Palau M.D.   On: 08/28/2018 11:34   Dg Humerus Left  Result Date: 09/04/2018 CLINICAL DATA:  Altered mental status EXAM: LEFT HUMERUS - 2+ VIEW COMPARISON:  06/21/2018 FINDINGS: Healing fracture left humeral neck. Moderate displacement and impaction unchanged from the prior study. No dislocation. Shoulder joint intact. Negative for acute fracture. IMPRESSION: Healing displaced fracture left humeral neck. Electronically Signed   By: Marlan Palau M.D.   On: 08/17/2018 11:36    EKG:   Orders placed or performed during the hospital encounter of 06/24/18  . EKG 12-Lead  . EKG 12-Lead  . ED EKG 12-Lead  . ED EKG 12-Lead    IMPRESSION AND PLAN:   Leslie Huffman  is a 85 y.o. female with a known  history of hypertension, CAD, left humeral fracture after mechanical fall and sepsis in March 2020 is brought in from [facility] nursing home for altered mental status.  1.  Septic shock-likely source urinary tract infection.  Urine cultures and blood cultures are pending -Severely hypotensive, lactic acid is elevated and also labs with acute renal failure -Continue fluid resuscitation.  Discussed with son who is POA over the phone.  He has indicated that if she does not respond to fluids, he would not  want vasopressors or  ICU stay. -He is open to discussion about hospice or comfort care at that point.  2.  Acute metabolic encephalopathy-secondary to underlying UTI and renal failure. -Continue to monitor.  CT head without any acute findings  3.  Acute renal failure-hyperkalemia-ATN from sepsis and hypotension likely. -Renal ultrasound ordered.  Hold lisinopril and Maxzide -Avoid nephrotoxins.  See improvement with IV fluids, if not consider nephrology consult -Son not interested in hemodialysis option  4.  Hypertension-given her hypotension, hold lisinopril, metoprolol and Maxide  5.  Hypothyroidism-continue her Synthroid  6.  DVT prophylaxis-we will recommend starting heparin subcutaneous  Since concern for PUI for SARS-Cov 2 , patient will be transferred to [facility] for further treatment.  Son has been updated    All the records are reviewed and case discussed with Consulting provider. Management plans discussed with the patient, family and they are in agreement.  CODE STATUS: DNR  TOTAL TIME TAKING CARE OF THIS PATIENT: 55 minutes.    Enid Baas M.D on 09-06-2018 at 3:31 PM  Between 7am to 6pm - Pager - [phone]  After 6pm go to [email or web address] - password [email or web address] [facility]  [facility]  Office  [phone]  CC: Primary care Physician: Dorothey Baseman, MD

## 2018-08-26 NOTE — ED Provider Notes (Addendum)
[facility] Emergency Department Provider Note   ____________________________________________   First MD Initiated Contact with Patient 08/18/2018 1059     (approximate)  I have reviewed the triage vital signs and the nursing notes.   HISTORY  Chief Complaint Altered Mental Status History limited by patient being nonverbal at present  HPI Leslie Huffman is a 85 y.o. female patient comes in with altered mental status from the nursing home.  This is apparently been going on for 2 days.  There is no other history available.  While in the emergency room patient becomes somewhat hypotensive.  We will give her fluids and treat her for sepsis.      Past Medical History:  Diagnosis Date   Heart attack [facility])    Hypertension    Vertigo     Patient Active Problem List   Diagnosis Date Noted   Sepsis (HCC) 06/24/2018    Past Surgical History:  Procedure Laterality Date   HIP SURGERY      Prior to Admission medications   Medication Sig Start Date End Date Taking? Authorizing Provider  acetaminophen (TYLENOL) 500 MG tablet Take 1,000 mg by mouth every 6 (six) hours as needed for moderate pain.    [provider]  aspirin EC 81 MG tablet Take 81 mg by mouth daily.     [provider]  Calcium Carbonate-Vitamin D 600-400 MG-UNIT chew tablet Chew 1 tablet by mouth daily.    [provider]  levothyroxine (SYNTHROID, LEVOTHROID) 50 MCG tablet Take 50 mcg by mouth daily before breakfast.  11/26/17   [provider]  lisinopril (PRINIVIL,ZESTRIL) 20 MG tablet Take 20 mg by mouth daily.  02/02/15   [provider]  metoprolol succinate (TOPROL-XL) 50 MG 24 hr tablet Take 50 mg by mouth daily.     [provider]  Multiple Vitamins-Minerals (PRESERVISION AREDS) TABS Take 1 tablet by mouth daily.    [provider]  tamsulosin (FLOMAX) 0.4 MG CAPS capsule Take 1 capsule (0.4 mg total) by mouth daily.  06/27/18   Adrian Saran, MD  triamterene-hydrochlorothiazide (MAXZIDE-25) 37.5-25 MG tablet Take 0.5 tablets by mouth daily.  02/02/15   [provider]    Allergies Sulfa antibiotics  History reviewed. No pertinent family history.  Social History Social History   Tobacco Use   Smoking status: Never Smoker   Smokeless tobacco: Never Used  Substance Use Topics   Alcohol use: No   Drug use: No    Review of Systems Unable to obtain ____________________________________________   PHYSICAL EXAM:  VITAL SIGNS: ED Triage Vitals  Enc Vitals Group     BP 08/15/2018 1130 (!) 110/52     Pulse Rate 09/08/2018 1130 93     Resp 08/18/2018 1130 (!) 22     Temp --      Temp src --      SpO2 09/04/2018 1106 96 %     Weight 08/22/2018 1107 130 lb (59 kg)     Height 09/09/2018 1107  (1.549 m)     Head Circumference --      Peak Flow --      Pain Score --      Pain Loc --      Pain Edu? --      Excl. in GC? --     Constitutional: Awake moans at times will not follow any commands Eyes: Conjunctivae are normal.  Head: Atraumatic. Nose: No congestion/rhinnorhea. Mouth/Throat: Mucous membranes are  moist.  Oropharynx non-erythematous. Neck: No stridor.  Cardiovascular: Normal rate, regular rhythm. Grossly normal heart sounds.  Good peripheral circulation. Respiratory: Normal respiratory effort.  No retractions. Lungs CTAB. Gastrointestinal: Soft and nontender. No distention. No abdominal bruits. No CVA tenderness. Musculoskeletal: No lower extremity tenderness nor edema.  She is guarding her left arm but her left arm has a history of shoulder fracture Neurologic:  . No gross focal neurologic deficits are appreciated. Skin:  Skin is warm, dry and intact. No rash noted.   ____________________________________________   LABS (all labs ordered are listed, but only abnormal results are displayed)  Labs Reviewed  COMPREHENSIVE METABOLIC PANEL - Abnormal; Notable for the following  components:      Result Value   Sodium 153 (*)    Potassium 5.3 (*)    Chloride 118 (*)    Glucose, Bld 177 (*)    BUN 142 (*)    Creatinine, Ser 4.47 (*)    GFR calc non Af Amer 8 (*)    GFR calc Af Amer 9 (*)    All other components within normal limits  LACTIC ACID, PLASMA - Abnormal; Notable for the following components:   Lactic Acid, Venous 2.6 (*)    All other components within normal limits  BRAIN NATRIURETIC PEPTIDE - Abnormal; Notable for the following components:   B Natriuretic Peptide 170.0 (*)    All other components within normal limits  CBC WITH DIFFERENTIAL/PLATELET - Abnormal; Notable for the following components:   RBC 3.40 (*)    Hemoglobin 10.8 (*)    HCT 34.5 (*)    MCV 101.5 (*)    Abs Immature Granulocytes 0.10 (*)    All other components within normal limits  URINALYSIS, COMPLETE (UACMP) WITH MICROSCOPIC - Abnormal; Notable for the following components:   Color, Urine YELLOW (*)    APPearance HAZY (*)    Nitrite POSITIVE (*)    Leukocytes,Ua SMALL (*)    Bacteria, UA MANY (*)    All other components within normal limits  SARS CORONAVIRUS 2 (HOSPITAL ORDER, PERFORMED IN [facility])  URINE CULTURE  SALICYLATE LEVEL  LACTIC ACID, PLASMA   ____________________________________________  EKG   ____________________________________________  RADIOLOGY  ED MD interpretation: Head CT read by radiology reviewed by me shows no acute pathology chest x-ray read by radiology reviewed by me shows no acute pathology x-ray of the humerus shows the old previously known humerus fracture that was also read by radiology reviewed by me  Official radiology report(s): Ct Head Wo Contrast  Result Date: 2018/09/13 CLINICAL DATA:  Altered mental status. EXAM: CT HEAD WITHOUT CONTRAST TECHNIQUE: Contiguous axial images were obtained from the base of the skull through the vertex without intravenous contrast. COMPARISON:  CT head dated November 24, 2017.  FINDINGS: Brain: No evidence of acute infarction, hemorrhage, hydrocephalus, extra-axial collection or mass lesion/mass effect. Stable atrophy and chronic microvascular ischemic changes. Vascular: Calcified atherosclerosis at the skullbase. No hyperdense vessel. Skull: Normal. Negative for fracture or focal lesion. Sinuses/Orbits: No acute finding. Other: None. IMPRESSION: 1.  No acute intracranial abnormality. Electronically Signed   By: Obie Dredge M.D.   On: September 13, 2018 12:31   Dg Chest Portable 1 View  Result Date: 13-Sep-2018 CLINICAL DATA:  Altered mental status EXAM: PORTABLE CHEST 1 VIEW COMPARISON:  06/24/2018 FINDINGS: Heart size is within normal limits. Negative for heart failure or pneumonia. Lungs are well aerated and clear Chronic fracture left humeral neck. Surgical plate fixation of right clavicle.  Moderate dextroscoliosis. IMPRESSION: No active disease. Electronically Signed   By: Marlan Palauharles  Clark M.D.   On: 08/25/2018 11:34   Dg Humerus Left  Result Date: 08/16/2018 CLINICAL DATA:  Altered mental status EXAM: LEFT HUMERUS - 2+ VIEW COMPARISON:  06/21/2018 FINDINGS: Healing fracture left humeral neck. Moderate displacement and impaction unchanged from the prior study. No dislocation. Shoulder joint intact. Negative for acute fracture. IMPRESSION: Healing displaced fracture left humeral neck. Electronically Signed   By: Marlan Palauharles  Clark M.D.   On: 08/19/2018 11:36    ____________________________________________   PROCEDURES  Procedure(s) performed (including Critical Care): Critical care time 55 minutes.  This includes evaluating the patient twice reviewing her lab work x-rays old records and speaking to the hospitalist and talking to the infection prevention people to find out that she actually had a positive coronavirus test last week and then talking to the hospitalist again in [place].  Procedures   ____________________________________________   INITIAL IMPRESSION /  ASSESSMENT AND PLAN / ED COURSE  Patient with altered mental status inanition rising sodium and AKI as well as lactic acidosis.  Patient has sepsis from UTI and the elevated lactic acidosis also probably from starvation.  We will get her antibiotics and fluids.   Leslie Huffman was evaluated in Emergency Department on 08/13/2018 for the symptoms described in the history of present illness. She was evaluated in the context of the global COVID-19 pandemic, which necessitated consideration that the patient might be at risk for infection with the SARS-CoV-2 virus that causes COVID-19. Institutional protocols and algorithms that pertain to the evaluation of patients at risk for COVID-19 are in a state of rapid change based on information released by regulatory bodies including the CDC and federal and state organizations. These policies and algorithms were followed during the patient's care in the ED.  ____________________________________________   FINAL CLINICAL IMPRESSION(S) / ED DIAGNOSES  Final diagnoses:  Altered mental status, unspecified altered mental status type  AKI (acute kidney injury) (HCC)  Hypernatremia  Dehydration  Urinary tract infection with hematuria, site unspecified     ED Discharge Orders    None       Note:  This document was prepared using Dragon voice recognition software and may include unintentional dictation errors.    Arnaldo NatalMalinda, Oluwaferanmi Wain F, MD 08/16/2018 1259 IP says the patient tested positive coronavirus last week.  [facility].  [facility] called me and talk to me and said they found to negative coronavirus test that white matter last week.  They do not want her.  They think she should stay here.  This is fine with us we will keep her here.   Arnaldo NatalMalinda, Onia Shiflett F, MD 08/30/2018 [phone]

## 2018-08-26 NOTE — ED Triage Notes (Signed)
Pt arrives via EMS from [facility] for staff reports of AMS x 2-3 days. Patient guarding left arm when trying to perform EKG. Patient is alert but not oriented. Patient arrives with an indwelling foley catheter.

## 2018-08-26 NOTE — ED Notes (Signed)
Date and time results received: 09-06-2018 2258 (use smartphrase ".now" to insert current time)  Test: troponin Critical Value: 0.14  Name of Provider Notified: Rito Ehrlich, MD  Orders Received? Or Actions Taken?:

## 2018-08-26 NOTE — ED Notes (Signed)
ED transfer consent completed

## 2018-08-26 NOTE — Progress Notes (Signed)
   [facility] [facility] [place] [facility] Day: 0 days Leslie Huffman is a 85 y.o. female presenting with Altered Mental Status .   Advance care planning discussed with patient's son Samanvitha Dorko who is patient's healthcare power of attorney.  Patient is confused and unable to participate in the discussion. Patient had her living will done during her last hospitalization when she was lucid and had indicated her wishes for DNR.  She did not want to be kept alive by any artificial means. She was brought in for altered mental status from [facility], noted to have UTI, sepsis and acute renal failure.  Blood pressure is very low in spite of 1 L of fluid bolus, receiving her second fluid bolus. Her COVID-19 test results here are negative.  But there has been an outbreak of COVID-19 positive patients at the facility that patient is coming from, according to son her test at the facility last week was negative twice.  But according to ER nurse, she was a positive at the nursing home. She still has dry cough.  Plan is to send her to [facility] campus for further treatment of her sepsis.  Discussed extensively with son, he is agreeable for the same.  Explained the tenuous situation given her severe sepsis/septic shock and acute renal failure.  Son understands.  Agreeable for treatment with IV fluids, antibiotics and monitoring renal function. Confirms that she is a DNR.  At any point if patient is not responding to fluids or worsening, son is open to comfort care and hospice.  Please to call him for any updates or change in patient's condition.  CODE STATUS:  DNR Time spent: 18 minutes

## 2018-08-26 NOTE — ED Notes (Signed)
[facility] called, said it would be over an hour before patient can be transported to [facility]

## 2018-08-26 NOTE — ED Notes (Signed)
Attempted to give report to [facility], will attempt again later

## 2018-08-26 NOTE — ED Notes (Signed)
Report given to Thayer Ohm, RN at [facility]

## 2018-08-26 NOTE — ED Notes (Signed)
Medical POA Shanelly Delvillar [phone]

## 2018-08-26 NOTE — ED Notes (Signed)
Attempted to call [facility] x2 to inform them patient is on the way and that antibody needs to be redrawn on arrival, no answer

## 2018-08-26 NOTE — Consult Note (Signed)
CODE SEPSIS - PHARMACY COMMUNICATION  **Broad Spectrum Antibiotics should be administered within 1 hour of Sepsis diagnosis**  Time Code Sepsis Called/Page Received: 2021  Antibiotics Ordered: cefepime  Time of 1st antibiotic administration: 1244  Additional action taken by pharmacy: none required  If necessary, Name of Provider/Nurse Contacted: N/A  Lowella Bandy ,PharmD Clinical Pharmacist  Sep 23, 2018  8:32 PM

## 2018-08-26 NOTE — ED Notes (Signed)
ED TO INPATIENT HANDOFF REPORT  ED Nurse Name and Phone #: [place] [phone]  S Name/Age/Gender Leslie Huffman 85 y.o. female Room/Bed: ED06A/ED06A  Code Status   Code Status: Prior  Home/SNF/Other Nursing Home Pt AMS, alert to verbal stimuli  Is this baseline? No   Triage Complete: Triage complete  Chief Complaint ams  Triage Note Pt arrives via EMS from [facility] for staff reports of AMS x 2-3 days. Patient guarding left arm when trying to perform EKG. Patient is alert but not oriented. Patient arrives with an indwelling foley catheter.    Allergies Allergies  Allergen Reactions  . Sulfa Antibiotics Nausea And Vomiting, Nausea Only and Other (See Comments)    Altered mental status Reaction: unknown Altered mental status Altered mental status    Level of Care/Admitting Diagnosis ED Disposition    ED Disposition Condition Comment   Admit  The patient appears reasonably stabilized for admission considering the current resources, flow, and capabilities available in the ED at this time, and I doubt any other [facility] requiring further screening and/or treatment in the ED prior to admission is  present.       B Medical/Surgery History Past Medical History:  Diagnosis Date  . Heart attack (HCC)   . Hypertension   . Vertigo    Past Surgical History:  Procedure Laterality Date  . HIP SURGERY       A IV Location/Drains/Wounds Patient Lines/Drains/Airways Status   Active Line/Drains/Airways    Name:   Placement date:   Placement time:   Site:   Days:   Peripheral IV 09/10/2018 Right Arm   08/23/2018    1052    Arm   less than 1   Urethral Catheter Corey Skains RN Double-lumen 14 Fr.   06/25/18    2016    Double-lumen   62          Intake/Output Last 24 hours No intake or output data in the 24 hours ending 09/09/2018 1321  Labs/Imaging Results for orders placed or performed during the hospital encounter of 09/07/2018 (from the past 48 hour(s))  Comprehensive metabolic  panel     Status: Abnormal   Collection Time: 08/30/2018 11:19 AM  Result Value Ref Range   Sodium 153 (H) 135 - 145 mmol/L   Potassium 5.3 (H) 3.5 - 5.1 mmol/L   Chloride 118 (H) 98 - 111 mmol/L   CO2 22 22 - 32 mmol/L   Glucose, Bld 177 (H) 70 - 99 mg/dL   BUN 161 (H) 8 - 23 mg/dL    Comment: RESULT CONFIRMED BY MANUAL DILUTION   Creatinine, Ser 4.47 (H) 0.44 - 1.00 mg/dL   Calcium 9.3 8.9 - 09.6 mg/dL   Total Protein 7.0 6.5 - 8.1 g/dL   Albumin 3.7 3.5 - 5.0 g/dL   AST 25 15 - 41 U/L   ALT 13 0 - 44 U/L   Alkaline Phosphatase 55 38 - 126 U/L   Total Bilirubin 0.7 0.3 - 1.2 mg/dL   GFR calc non Af Amer 8 (L) >60 mL/min   GFR calc Af Amer 9 (L) >60 mL/min   Anion gap 13 5 - 15    Comment: Performed at [facility], [address] [place], [place] [address]  Lactic acid, plasma     Status: Abnormal   Collection Time: 09/04/2018 11:19 AM  Result Value Ref Range   Lactic Acid, Venous 2.6 (HH) 0.5 - 1.9 mmol/L    Comment: CRITICAL  RESULT CALLED TO, READ BACK BY AND VERIFIED WITH [place]ORDAN Samadhi Mahurin AT 12:33 ON 09/03/2018 KLM Performed at [facility], [address] [place], [place] [address]   Brain natriuretic peptide     Status: Abnormal   Collection Time: 08/17/2018 11:19 AM  Result Value Ref Range   B Natriuretic Peptide 170.0 (H) 0.0 - 100.0 pg/mL    Comment: Performed at [facility], [address] [place], [place] [address]  Salicylate level     Status: None   Collection Time: 09/10/2018 11:19 AM  Result Value Ref Range   Salicylate Lvl <7.0 2.8 - 30.0 mg/dL    Comment: Performed at [facility], [address] [place], [place] [address]  CBC with Differential     Status: Abnormal   Collection Time: 08/27/2018 11:19 AM  Result Value Ref Range   WBC 6.9 4.0 - 10.5 K/uL   RBC 3.40 (L) 3.87 - 5.11 MIL/uL   Hemoglobin 10.8 (L) 12.0 - 15.0 g/dL   HCT 91.434.5 (L) 78.236.0 - 95.646.0 %   MCV 101.5 (H) 80.0 - 100.0 fL   MCH 31.8 26.0 - 34.0 pg   MCHC 31.3  30.0 - 36.0 g/dL   RDW 21.314.8 08.611.5 - 57.815.5 %   Platelets 208 150 - 400 K/uL   nRBC 0.0 0.0 - 0.2 %   Neutrophils Relative % 79 %   Neutro Abs 5.5 1.7 - 7.7 K/uL   Lymphocytes Relative 14 %   Lymphs Abs 0.9 0.7 - 4.0 K/uL   Monocytes Relative 5 %   Monocytes Absolute 0.3 0.1 - 1.0 K/uL   Eosinophils Relative 0 %   Eosinophils Absolute 0.0 0.0 - 0.5 K/uL   Basophils Relative 0 %   Basophils Absolute 0.0 0.0 - 0.1 K/uL   Immature Granulocytes 2 %   Abs Immature Granulocytes 0.10 (H) 0.00 - 0.07 K/uL    Comment: Performed at [facility], [address] [place], [place] [address]  Urinalysis, Complete w Microscopic     Status: Abnormal   Collection Time: 09/09/2018 11:19 AM  Result Value Ref Range   Color, Urine YELLOW (A) YELLOW   APPearance HAZY (A) CLEAR   Specific Gravity, Urine 1.014 1.005 - 1.030   pH 5.0 5.0 - 8.0   Glucose, UA NEGATIVE NEGATIVE mg/dL   Hgb urine dipstick NEGATIVE NEGATIVE   Bilirubin Urine NEGATIVE NEGATIVE   Ketones, ur NEGATIVE NEGATIVE mg/dL   Protein, ur NEGATIVE NEGATIVE mg/dL   Nitrite POSITIVE (A) NEGATIVE   Leukocytes,Ua SMALL (A) NEGATIVE   RBC / HPF 0-5 0 - 5 RBC/hpf   WBC, UA 21-50 0 - 5 WBC/hpf   Bacteria, UA MANY (A) NONE SEEN   Squamous Epithelial / LPF 0-5 0 - 5   Mucus PRESENT     Comment: Performed at [facility], [address] [place], [place] [address]  SARS Coronavirus 2 [facility](Hospital order, Performed in [facility] hospital lab)     Status: None   Collection Time: 08/28/2018 11:20 AM  Result Value Ref Range   SARS Coronavirus 2 NEGATIVE NEGATIVE    Comment: (NOTE) If result is NEGATIVE SARS-CoV-2 target nucleic acids are NOT DETECTED. The SARS-CoV-2 RNA is generally detectable in upper and lower  respiratory specimens during the acute phase of infection. The lowest  concentration of SARS-CoV-2 viral copies this assay can detect is 250  copies / mL. A negative result does not preclude SARS-CoV-2 infection  and  should not be  used as the sole basis for treatment or other  patient management decisions.  A negative result may occur with  improper specimen collection / handling, submission of specimen other  than nasopharyngeal swab, presence of viral mutation(s) within the  areas targeted by this assay, and inadequate number of viral copies  (<250 copies / mL). A negative result must be combined with clinical  observations, patient history, and epidemiological information. If result is POSITIVE SARS-CoV-2 target nucleic acids are DETECTED. The SARS-CoV-2 RNA is generally detectable in upper and lower  respiratory specimens dur ing the acute phase of infection.  Positive  results are indicative of active infection with SARS-CoV-2.  Clinical  correlation with patient history and other diagnostic information is  necessary to determine patient infection status.  Positive results do  not rule out bacterial infection or co-infection with other viruses. If result is PRESUMPTIVE POSTIVE SARS-CoV-2 nucleic acids MAY BE PRESENT.   A presumptive positive result was obtained on the submitted specimen  and confirmed on repeat testing.  While 2019 novel coronavirus  (SARS-CoV-2) nucleic acids may be present in the submitted sample  additional confirmatory testing may be necessary for epidemiological  and / or clinical management purposes  to differentiate between  SARS-CoV-2 and other Sarbecovirus currently known to infect humans.  If clinically indicated additional testing with an alternate test  methodology [phone]) is advised. The SARS-CoV-2 RNA is generally  detectable in upper and lower respiratory sp ecimens during the acute  phase of infection. The expected result is Negative. Fact Sheet for Patients:  BoilerBrush.com.cy Fact Sheet for Healthcare Providers: https://pope.com/ This test is not yet approved or cleared by the [place] FDA and has been  authorized for detection and/or diagnosis of SARS-CoV-2 by FDA under an Emergency Use Authorization (EUA).  This EUA will remain in effect (meaning this test can be used) for the duration of the COVID-19 declaration under Section 564(b)(1) of the Act, 21 U.S.C. section 360bbb-3(b)(1), unless the authorization is terminated or revoked sooner. Performed at [facility], [address] [place], [place] [address]    Ct Head Wo Contrast  Result Date: 09/21/18 CLINICAL DATA:  Altered mental status. EXAM: CT HEAD WITHOUT CONTRAST TECHNIQUE: Contiguous axial images were obtained from the base of the skull through the vertex without intravenous contrast. COMPARISON:  CT head dated November 24, 2017. FINDINGS: Brain: No evidence of acute infarction, hemorrhage, hydrocephalus, extra-axial collection or mass lesion/mass effect. Stable atrophy and chronic microvascular ischemic changes. Vascular: Calcified atherosclerosis at the skullbase. No hyperdense vessel. Skull: Normal. Negative for fracture or focal lesion. Sinuses/Orbits: No acute finding. Other: None. IMPRESSION: 1.  No acute intracranial abnormality. Electronically Signed   By: Obie Dredge M.D.   On: 2018/09/21 12:31   Dg Chest Portable 1 View  Result Date: 09/21/18 CLINICAL DATA:  Altered mental status EXAM: PORTABLE CHEST 1 VIEW COMPARISON:  06/24/2018 FINDINGS: Heart size is within normal limits. Negative for heart failure or pneumonia. Lungs are well aerated and clear Chronic fracture left humeral neck. Surgical plate fixation of right clavicle. Moderate dextroscoliosis. IMPRESSION: No active disease. Electronically Signed   By: Marlan Palau M.D.   On: 09-21-2018 11:34   Dg Humerus Left  Result Date: 09-21-2018 CLINICAL DATA:  Altered mental status EXAM: LEFT HUMERUS - 2+ VIEW COMPARISON:  06/21/2018 FINDINGS: Healing fracture left humeral neck. Moderate displacement and impaction unchanged from the prior study. No  dislocation. Shoulder joint intact. Negative for acute fracture. IMPRESSION: Healing displaced fracture left  humeral neck. Electronically Signed   By: Marlan Palau M.D.   On: 08/23/2018 11:36    Pending Labs Unresulted Labs (From admission, onward)    Start     Ordered   09/04/2018 1112  Lactic acid, plasma  Now then every 2 hours,   STAT     08/25/2018 1112   08/24/2018 1112  Urine culture  Once,   STAT     08/15/2018 1112          Vitals/Pain Today's Vitals   08/30/2018 1130 08/30/2018 1200 09/11/2018 1207 09/11/2018 1300  BP: (!) 110/52 (!) 93/53  102/73  Pulse: 93 92 92 87  Resp: (!) 22 18 19 20   SpO2: 92% 90% 96% 100%  Weight:      Height:        Isolation Precautions Droplet and Contact precautions  Medications Medications  sodium chloride 0.9 % bolus 1,000 mL (1,000 mLs Intravenous New Bag/Given 08/25/2018 1244)  ceFEPIme (MAXIPIME) 1 g in sodium chloride 0.9 % 100 mL IVPB (1 g Intravenous New Bag/Given 08/25/2018 1244)    Mobility walks with device High fall risk   Focused Assessments    R Recommendations: See Admitting Provider Note  Report given to:   Additional Notes:

## 2018-08-26 NOTE — Progress Notes (Signed)
Infection Prevention  Call to [facility] nursing staff and they could not find results of a positive covid test on patient. [facility] Dept. Spoke with Glenna Fellows who is working with the facility  and received verbal and faxed copy of COVID testing. Patient tested negative on [facility] test 4/29 and tested negative on state lab test 08/17/2018. Reported this to [facility] RN in ED and faxed copy of labs. Notified [facility] IP and Dr. Ninetta Lights of findings and flag removed from patient chart.

## 2018-08-26 NOTE — ED Notes (Signed)
Patient transported to CT 

## 2018-08-26 NOTE — ED Notes (Signed)
This RN spoke with patient's POA, son Leslie Huffman, to ensure he was updated on patient's impending transfer to [facility]. POA affirmed that he was aware of transfer, and this RN gave POA [facility] phone # and patient's room # so he could contact her after transfer. POA was insistent that all procedures be discussed with him, especially does not want patient to get a central line. Notified POA that this would need to be discussed with MD

## 2018-08-27 ENCOUNTER — Other Ambulatory Visit: Payer: Self-pay

## 2018-08-27 DIAGNOSIS — N390 Urinary tract infection, site not specified: Secondary | ICD-10-CM

## 2018-08-27 DIAGNOSIS — U071 COVID-19: Secondary | ICD-10-CM

## 2018-08-27 DIAGNOSIS — G9341 Metabolic encephalopathy: Secondary | ICD-10-CM | POA: Diagnosis present

## 2018-08-27 DIAGNOSIS — N179 Acute kidney failure, unspecified: Secondary | ICD-10-CM

## 2018-08-27 DIAGNOSIS — A419 Sepsis, unspecified organism: Secondary | ICD-10-CM

## 2018-08-27 DIAGNOSIS — R778 Other specified abnormalities of plasma proteins: Secondary | ICD-10-CM | POA: Diagnosis present

## 2018-08-27 DIAGNOSIS — R6520 Severe sepsis without septic shock: Secondary | ICD-10-CM

## 2018-08-27 DIAGNOSIS — E870 Hyperosmolality and hypernatremia: Secondary | ICD-10-CM

## 2018-08-27 LAB — GLUCOSE, CAPILLARY
Glucose-Capillary: 106 mg/dL — ABNORMAL HIGH (ref 70–99)
Glucose-Capillary: 110 mg/dL — ABNORMAL HIGH (ref 70–99)
Glucose-Capillary: 106 mg/dL — ABNORMAL HIGH (ref 70–99)
Glucose-Capillary: 116 mg/dL — ABNORMAL HIGH (ref 70–99)
Glucose-Capillary: 110 mg/dL — ABNORMAL HIGH (ref 70–99)

## 2018-08-27 LAB — BASIC METABOLIC PANEL
Anion gap: 10 (ref 5–15)
BUN: 95 mg/dL — ABNORMAL HIGH (ref 8–23)
CO2: 17 mmol/L — ABNORMAL LOW (ref 22–32)
Calcium: 8 mg/dL — ABNORMAL LOW (ref 8.9–10.3)
Chloride: 128 mmol/L — ABNORMAL HIGH (ref 98–111)
Creatinine, Ser: 2.53 mg/dL — ABNORMAL HIGH (ref 0.44–1.00)
GFR calc Af Amer: 19 mL/min — ABNORMAL LOW (ref >60)
GFR calc non Af Amer: 16 mL/min — ABNORMAL LOW (ref >60)
Glucose, Bld: 114 mg/dL — ABNORMAL HIGH (ref 70–99)
Potassium: 4 mmol/L (ref 3.5–5.1)
Sodium: 155 mmol/L — ABNORMAL HIGH (ref 135–145)

## 2018-08-27 LAB — MAGNESIUM: Magnesium: 2.3 mg/dL (ref 1.7–2.4)

## 2018-08-27 LAB — HEMOGLOBIN A1C
Hgb A1c MFr Bld: 6.4 % — ABNORMAL HIGH (ref 4.8–5.6)
Hgb A1c MFr Bld: 6.4 % — ABNORMAL HIGH (ref 4.8–5.6)
Mean Plasma Glucose: 136.98 mg/dL
Mean Plasma Glucose: 136.98 mg/dL

## 2018-08-27 LAB — CBC
HCT: 31.4 % — ABNORMAL LOW (ref 36.0–46.0)
Hemoglobin: 9.6 g/dL — ABNORMAL LOW (ref 12.0–15.0)
MCH: 32 pg (ref 26.0–34.0)
MCHC: 30.6 g/dL (ref 30.0–36.0)
MCV: 104.7 fL — ABNORMAL HIGH (ref 80.0–100.0)
Platelets: 183 10*3/uL (ref 150–400)
RBC: 3 MIL/uL — ABNORMAL LOW (ref 3.87–5.11)
RDW: 15.4 % (ref 11.5–15.5)
WBC: 8.4 10*3/uL (ref 4.0–10.5)
nRBC: 0 % (ref 0.0–0.2)

## 2018-08-27 LAB — TROPONIN I
Troponin I: 0.18 ng/mL (ref <0.03)
Troponin I: 0.16 ng/mL (ref <0.03)

## 2018-08-27 LAB — CREATININE, SERUM
Creatinine, Ser: 3.35 mg/dL — ABNORMAL HIGH (ref 0.44–1.00)
GFR calc Af Amer: 13 mL/min — ABNORMAL LOW (ref >60)
GFR calc non Af Amer: 11 mL/min — ABNORMAL LOW (ref >60)

## 2018-08-27 LAB — ABO/RH: ABO/RH(D): O POS

## 2018-08-27 LAB — MRSA PCR SCREENING: MRSA by PCR: NEGATIVE

## 2018-08-27 MED ORDER — POLYETHYLENE GLYCOL 3350 17 G PO PACK: 17.0000 g | PACK | Freq: Every day | ORAL | Status: DC | PRN

## 2018-08-27 MED ORDER — ACETAMINOPHEN 325 MG PO TABS: 650.0000 mg | ORAL_TABLET | Freq: Four times a day (QID) | ORAL | Status: DC | PRN

## 2018-08-27 MED ORDER — SODIUM CHLORIDE 0.9 % IV BOLUS
500.0000 mL | Freq: Once | INTRAVENOUS | Status: AC
  Administered 2018-08-27: 500 mL via INTRAVENOUS

## 2018-08-27 MED ORDER — ENOXAPARIN SODIUM 30 MG/0.3ML ~~LOC~~ SOLN: 30.0000 mg | SUBCUTANEOUS | Status: DC

## 2018-08-27 MED ORDER — SODIUM CHLORIDE 0.45 % IV SOLN
INTRAVENOUS | Status: DC
  Administered 2018-08-27 (×2): via INTRAVENOUS

## 2018-08-27 MED ORDER — ONDANSETRON HCL 4 MG/2ML IJ SOLN: 4.0000 mg | Freq: Four times a day (QID) | INTRAMUSCULAR | Status: DC | PRN

## 2018-08-27 MED ORDER — ASPIRIN EC 81 MG PO TBEC: 81.0000 mg | DELAYED_RELEASE_TABLET | Freq: Every day | ORAL | Status: DC

## 2018-08-27 MED ORDER — TAMSULOSIN HCL 0.4 MG PO CAPS: 0.4000 mg | ORAL_CAPSULE | Freq: Every day | ORAL | Status: DC

## 2018-08-27 MED ORDER — INSULIN ASPART 100 UNIT/ML ~~LOC~~ SOLN: 0.0000 [IU] | Freq: Three times a day (TID) | SUBCUTANEOUS | Status: DC

## 2018-08-27 MED ORDER — MORPHINE SULFATE (PF) 2 MG/ML IV SOLN
0.5000 mg | INTRAVENOUS | Status: DC | PRN
  Administered 2018-08-27: 1 mg via INTRAVENOUS
  Administered 2018-08-28: 0.5 mg via INTRAVENOUS
  Filled 2018-08-27 (×2): qty 1

## 2018-08-27 MED ORDER — SODIUM CHLORIDE 0.9% FLUSH
3.0000 mL | Freq: Two times a day (BID) | INTRAVENOUS | Status: DC
  Administered 2018-08-27 – 2018-08-28 (×3): 3 mL via INTRAVENOUS

## 2018-08-27 MED ORDER — ENSURE ENLIVE PO LIQD
237.0000 mL | Freq: Three times a day (TID) | ORAL | Status: DC
  Administered 2018-08-27: 237 mL via ORAL

## 2018-08-27 MED ORDER — LEVOTHYROXINE SODIUM 50 MCG PO TABS: 50.0000 ug | ORAL_TABLET | Freq: Every day | ORAL | Status: DC

## 2018-08-27 MED ORDER — SODIUM CHLORIDE 0.9 % IV SOLN
1.0000 g | INTRAVENOUS | Status: DC
  Administered 2018-08-28: 1 g via INTRAVENOUS
  Filled 2018-08-27 (×2): qty 10

## 2018-08-27 MED ORDER — HEPARIN SODIUM (PORCINE) 5000 UNIT/ML IJ SOLN
5000.0000 [IU] | Freq: Three times a day (TID) | INTRAMUSCULAR | Status: DC
  Administered 2018-08-27 – 2018-08-28 (×5): 5000 [IU] via SUBCUTANEOUS
  Filled 2018-08-27 (×5): qty 1

## 2018-08-27 MED ORDER — ONDANSETRON HCL 4 MG PO TABS: 4.0000 mg | ORAL_TABLET | Freq: Four times a day (QID) | ORAL | Status: DC | PRN

## 2018-08-27 MED ORDER — PROSIGHT PO TABS
1.0000 | ORAL_TABLET | Freq: Every day | ORAL | Status: DC
  Filled 2018-08-27 (×3): qty 1

## 2018-08-27 MED ORDER — ORAL CARE MOUTH RINSE
15.0000 mL | Freq: Two times a day (BID) | OROMUCOSAL | Status: DC
  Administered 2018-08-27 – 2018-08-28 (×2): 15 mL via OROMUCOSAL

## 2018-08-27 MED ORDER — ACETAMINOPHEN 650 MG RE SUPP
650.0000 mg | RECTAL | Status: DC | PRN
  Administered 2018-08-28 (×2): 650 mg via RECTAL
  Filled 2018-08-27 (×3): qty 1

## 2018-08-27 NOTE — Progress Notes (Signed)
[facility] TEAM 1 - Stepdown/ICU TEAM  Leslie Huffman  ZOX:[id number] DOB: [birth year] DOA: 2018-04-17 PCP: Dorothey BasemanBronstein, David, MD    Brief Narrative:  [phone] SNF resident w/ a hx of stage III CKD, HTN, and CAD who was [facility]/14 with a few days of confusion. In the ED she was found to be hypotensive w/ severe acute renal failure (BUN 142 / crt 4.47). CT head was unremarkable. Her workup was c/w UTI w/ sepsis, and TME. She was found to be + for SARS CoV-2 (on a repeat test - initial test was negative). Despite this she had a negative CXR and no hypoxia .   Significant Events: 5/14 [facility]   COVID-19 specific Treatment: none  Subjective: Pt was seen for a f/u visit.   Assessment & Plan:  Klebsiella oxytoca UTI w/ Sepsis   SARS CoV-2 +  Acute kidney failure   Hypernatremia   Hyperkalemia  Elevated troponin  Most c/w demand ischemia - no significant peak  HTN  Hypothryroidism   Macrocytosis  Hyperglycemia No hx of DM - A1c 6.4   DVT prophylaxis: SQ heparin  Code Status: DNR - NO CODE Family Communication:   Disposition Plan:   Consultants:  none  Antimicrobials:  Cefepime 5/14 >   Objective: Blood pressure (!) 113/59, pulse 90, temperature 99.4 F (37.4 C), temperature source Oral, resp. rate 16, SpO2 94 %.  Intake/Output Summary (Last 24 hours) at 08/27/2018 0929 Last data filed at 08/27/2018 0610 Gross per 24 hour  Intake -  Output 400 ml  Net -400 ml   There were no vitals filed for this visit.  Examination: F/u exam completed    CBC: Recent Labs  Lab 23-Jan-2019 1119 08/27/18 0105  WBC 6.9 8.4  NEUTROABS 5.5  --   HGB 10.8* 9.6*  HCT 34.5* 31.4*  MCV 101.5* 104.7*  PLT 208 183   Basic Metabolic Panel: Recent Labs  Lab 23-Jan-2019 1119 23-Jan-2019 1819 08/27/18 0105  NA 153* 156*  --   K 5.3* 5.2*  --   CL 118* 126*  --   CO2 22 22  --   GLUCOSE 177* 138*  --   BUN 142* 129*  --   CREATININE 4.47*  3.52* 3.35*  CALCIUM 9.3 8.6*  --    GFR: Estimated Creatinine Clearance: 9 mL/min (A) (by C-G formula based on SCr of 3.35 mg/dL (H)).  Liver Function Tests: Recent Labs  Lab 23-Jan-2019 1119  AST 25  ALT 13  ALKPHOS 55  BILITOT 0.7  PROT 7.0  ALBUMIN 3.7    Cardiac Enzymes: Recent Labs  Lab 23-Jan-2019 2202 08/27/18 0105 08/27/18 0640  TROPONINI 0.14* 0.18* 0.16*    HbA1C: Hgb A1c MFr Bld  Date/Time Value Ref Range Status  08/27/2018 01:05 AM 6.4 (H) 4.8 - 5.6 % Final    Comment:    (NOTE) Pre diabetes:          5.7%-6.4% Diabetes:              >6.4% Glycemic control for   <7.0% adults with diabetes     CBG: Recent Labs  Lab 08/27/18 0054 08/27/18 0751  GLUCAP 106* 110*    Recent Results (from the past 240 hour(s))  SARS Coronavirus 2 [facility](Hospital order, Performed in [facility] hospital lab)     Status: None   Collection Time: 23-Jan-2019 11:20 AM  Result Value Ref Range Status   SARS Coronavirus  2 NEGATIVE NEGATIVE Final    Comment: (NOTE) If result is NEGATIVE SARS-CoV-2 target nucleic acids are NOT DETECTED. The SARS-CoV-2 RNA is generally detectable in upper and lower  respiratory specimens during the acute phase of infection. The lowest  concentration of SARS-CoV-2 viral copies this assay can detect is 250  copies / mL. A negative result does not preclude SARS-CoV-2 infection  and should not be used as the sole basis for treatment or other  patient management decisions.  A negative result may occur with  improper specimen collection / handling, submission of specimen other  than nasopharyngeal swab, presence of viral mutation(s) within the  areas targeted by this assay, and inadequate number of viral copies  (<250 copies / mL). A negative result must be combined with clinical  observations, patient history, and epidemiological information. If result is POSITIVE SARS-CoV-2 target nucleic acids are DETECTED. The SARS-CoV-2 RNA is generally detectable in  upper and lower  respiratory specimens dur ing the acute phase of infection.  Positive  results are indicative of active infection with SARS-CoV-2.  Clinical  correlation with patient history and other diagnostic information is  necessary to determine patient infection status.  Positive results do  not rule out bacterial infection or co-infection with other viruses. If result is PRESUMPTIVE POSTIVE SARS-CoV-2 nucleic acids MAY BE PRESENT.   A presumptive positive result was obtained on the submitted specimen  and confirmed on repeat testing.  While 2019 novel coronavirus  (SARS-CoV-2) nucleic acids may be present in the submitted sample  additional confirmatory testing may be necessary for epidemiological  and / or clinical management purposes  to differentiate between  SARS-CoV-2 and other Sarbecovirus currently known to infect humans.  If clinically indicated additional testing with an alternate test  methodology [phone]) is advised. The SARS-CoV-2 RNA is generally  detectable in upper and lower respiratory sp ecimens during the acute  phase of infection. The expected result is Negative. Fact Sheet for Patients:  BoilerBrush.com.cy Fact Sheet for Healthcare Providers: https://pope.com/ This test is not yet approved or cleared by the [place] FDA and has been authorized for detection and/or diagnosis of SARS-CoV-2 by FDA under an Emergency Use Authorization (EUA).  This EUA will remain in effect (meaning this test can be used) for the duration of the COVID-19 declaration under Section 564(b)(1) of the Act, 21 U.S.C. section 360bbb-3(b)(1), unless the authorization is terminated or revoked sooner. Performed at [facility], [address] [place], [place] [address]   SARS Coronavirus 2 [facility] order, Performed in [facility] hospital lab)     Status: Abnormal   Collection Time: 09/06/2018  4:21 PM  Result Value Ref  Range Status   SARS Coronavirus 2 POSITIVE (A) NEGATIVE Final    Comment: RESULT CALLED TO, READ BACK BY AND VERIFIED WITH: RACHAEL HAIDEN 08/23/2018 @ 1758  MLK (NOTE) If result is NEGATIVE SARS-CoV-2 target nucleic acids are NOT DETECTED. The SARS-CoV-2 RNA is generally detectable in upper and lower  respiratory specimens during the acute phase of infection. The lowest  concentration of SARS-CoV-2 viral copies this assay can detect is 250  copies / mL. A negative result does not preclude SARS-CoV-2 infection  and should not be used as the sole basis for treatment or other  patient management decisions.  A negative result may occur with  improper specimen collection / handling, submission of specimen other  than nasopharyngeal swab, presence of viral mutation(s) within the  areas targeted by this assay, and inadequate  number of viral copies  (<250 copies / mL). A negative result must be combined with clinical  observations, patient history, and epidemiological information. If result is POSITIVE SARS-CoV-2 target nucleic acids are DETECTED. Th e SARS-CoV-2 RNA is generally detectable in upper and lower  respiratory specimens during the acute phase of infection.  Positive  results are indicative of active infection with SARS-CoV-2.  Clinical  correlation with patient history and other diagnostic information is  necessary to determine patient infection status.  Positive results do  not rule out bacterial infection or co-infection with other viruses. If result is PRESUMPTIVE POSTIVE SARS-CoV-2 nucleic acids MAY BE PRESENT.   A presumptive positive result was obtained on the submitted specimen  and confirmed on repeat testing.  While 2019 novel coronavirus  (SARS-CoV-2) nucleic acids may be present in the submitted sample  additional confirmatory testing may be necessary for epidemiological  and / or clinical management purposes  to differentiate between  SARS-CoV-2 and other Sarbecovirus  currently known to infect humans.  If clinically indicated additional testing with an alternate test  methodology [phone]) is  advised. The SARS-CoV-2 RNA is generally  detectable in upper and lower respiratory specimens during the acute  phase of infection. The expected result is Negative. Fact Sheet for Patients:  BoilerBrush.com.cy Fact Sheet for Healthcare Providers: https://pope.com/ This test is not yet approved or cleared by the [place] FDA and has been authorized for detection and/or diagnosis of SARS-CoV-2 by FDA under an Emergency Use Authorization (EUA).  This EUA will remain in effect (meaning this test can be used) for the duration of the COVID-19 declaration under Section 564(b)(1) of the Act, 21 U.S.C. section 360bbb-3(b)(1), unless the authorization is terminated or revoked sooner. Performed at [facility], [address] [place], [place] [address]      Scheduled Meds: . aspirin EC  81 mg Oral Daily  . heparin injection (subcutaneous)  5,000 Units Subcutaneous Q8H  . insulin aspart  0-9 Units Subcutaneous TID WC  . levothyroxine  50 mcg Oral QAC breakfast  . multivitamin  1 tablet Oral Daily  . sodium chloride flush  3 mL Intravenous Q12H  . tamsulosin  0.4 mg Oral Daily      LOS: 1 day   Lonia Blood, MD [facility] Office  [phone] Pager - Text Page per Amion  If 7PM-7AM, please contact night-coverage per Amion 08/27/2018, 9:29 AM

## 2018-08-27 NOTE — Plan of Care (Signed)
  Problem: Coping: Goal: Psychosocial and spiritual needs will be supported Outcome: Progressing   Problem: Respiratory: Goal: Will maintain a patent airway Outcome: Progressing Goal: Complications related to the disease process, condition or treatment will be avoided or minimized Outcome: Progressing   Problem: Clinical Measurements: Goal: Ability to maintain clinical measurements within normal limits will improve Outcome: Progressing Goal: Diagnostic test results will improve Outcome: Progressing Goal: Respiratory complications will improve Outcome: Progressing Goal: Cardiovascular complication will be avoided Outcome: Progressing   Problem: Coping: Goal: Level of anxiety will decrease Outcome: Progressing   Problem: Elimination: Goal: Will not experience complications related to bowel motility Outcome: Progressing Goal: Will not experience complications related to urinary retention Outcome: Progressing   Problem: Pain Managment: Goal: General experience of comfort will improve Outcome: Progressing   Problem: Safety: Goal: Ability to remain free from injury will improve Outcome: Progressing   Problem: Skin Integrity: Goal: Risk for impaired skin integrity will decrease Outcome: Progressing   Problem: Education: Goal: Knowledge of risk factors and measures for prevention of condition will improve Outcome: Not Progressing Note:  Due to altered mental status   Problem: Education: Goal: Knowledge of General Education information will improve Description Including pain rating scale, medication(s)/side effects and non-pharmacologic comfort measures Outcome: Not Progressing Note:  No evidence of learning due to altered mental status   Problem: Health Behavior/Discharge Planning: Goal: Ability to manage health-related needs will improve Outcome: Not Progressing   Problem: Nutrition: Goal: Adequate nutrition will be maintained Outcome: Not Progressing Note:   Poor appetite, refuses to eat

## 2018-08-27 NOTE — H&P (Addendum)
History and Physical  [name]:[id number] DOB: March 11, 1925 DOA: Sep 11, 2018  Referring physician: Cecil Cobbs, ER physician PCP: Dorothey Baseman, MD  Outpatient Specialists: None Patient coming from: Nursing facility  Chief Complaint: Confusion  HPI: Leslie Huffman is a 85 y.o. female with medical history significant for stage III chronic kidney disease, hypertension and CAD who comes from a nursing facility on the evening of 5/14 after being noted to have confusion for the past few days.   ED Course: In the emergency room, still confused, but became more hypotensive.  CT scan of the head unremarkable.  Given IV fluids and lactic initial lactic acid level at 2.8.  Patient found to have a large urinary tract infection.  Her COVID-19 test was positive, however patient not having any symptoms and had a negative chest x-ray as well as no need for oxygenation.  Also found to have markedly acute kidney injury in the setting of her stage III chronic kidney disease with initial creatinine of 4.47 and BUN of 142.  Her creatinine 2 months prior was at 1.06.  Patient given IV fluid boluses and creatinine improved down to 3.52.  Sodium also noted to be elevated at 153.  Hospitalist were called for further evaluation.  Prior to transport, troponin found to be elevated at 0.14 and hospitalist updated  Review of Systems: Patient seen after arrival to floor. Pt remains confused and occasionally moans.  She is unable to answer my questions.  She is not able to give me a review of systems.    Past Medical History:  Diagnosis Date  . Heart attack (HCC)   . Hypertension   . Vertigo    Past Surgical History:  Procedure Laterality Date  . HIP SURGERY      Social History: Based on previous social history reports, patient does not smoke or do drugs or drink alcohol.  She is currently confused and unable to give me any kind of current social history.  She resides in a skilled nursing facility.  Her  ambulatory status is unclear.  Allergies  Allergen Reactions  . Sulfa Antibiotics Nausea And Vomiting, Nausea Only and Other (See Comments)    Altered mental status Reaction: unknown Altered mental status Altered mental status    Family history: I am unable to get a family history from this patient  Prior to Admission medications   Medication Sig Start Date End Date Taking? Authorizing Provider  acetaminophen (TYLENOL) 500 MG tablet Take 1,000 mg by mouth every 6 (six) hours as needed for moderate pain.    [provider]  aspirin EC 81 MG tablet Take 81 mg by mouth daily.     [provider]  Calcium Carbonate-Vitamin D 600-400 MG-UNIT chew tablet Chew 1 tablet by mouth daily.    [provider]  levothyroxine (SYNTHROID, LEVOTHROID) 50 MCG tablet Take 50 mcg by mouth daily before breakfast.  11/26/17   [provider]  lisinopril (PRINIVIL,ZESTRIL) 20 MG tablet Take 20 mg by mouth daily.  02/02/15   [provider]  metoprolol succinate (TOPROL-XL) 50 MG 24 hr tablet Take 50 mg by mouth daily.     [provider]  Multiple Vitamins-Minerals (PRESERVISION AREDS) TABS Take 1 tablet by mouth daily.    [provider]  tamsulosin (FLOMAX) 0.4 MG CAPS capsule Take 1 capsule (0.4 mg total) by mouth daily. 06/27/18   Adrian Saran, MD  triamterene-hydrochlorothiazide (MAXZIDE-25) 37.5-25 MG tablet Take 1 tablet by mouth daily.  02/02/15  [provider]    Physical Exam: BP (!) 84/65 (BP Location: Right Arm)   Pulse 90   Temp 97.9 F (36.6 C) (Oral)   Resp 16   SpO2 94%   General: Acutely confused.  Does not really respond to my voice or her name.  Just moans Eyes: Sclera appear nonicteric, extraocular movements appear intact ENT: Normocephalic and atraumatic, mucous membranes are dry Neck: Supple, no JVD Cardiovascular: Regular rate and rhythm, S1-S2, prominent 2 out of 6 systolic ejection murmur Respiratory:  Moderate inspiratory effort, clear to auscultation bilaterally Abdomen: Soft, nontender, nondistended, positive bowel sounds Skin: No skin breaks, tears or lesions Musculoskeletal: No clubbing or cyanosis or edema Psychiatric: Appropriate, no evidence of psychoses Neurologic: No focal deficits          Labs on Admission:  Basic Metabolic Panel: Recent Labs  Lab 09/11/18 1119 09-11-18 1819  NA 153* 156*  K 5.3* 5.2*  CL 118* 126*  CO2 22 22  GLUCOSE 177* 138*  BUN 142* 129*  CREATININE 4.47* 3.52*  CALCIUM 9.3 8.6*   Liver Function Tests: Recent Labs  Lab 11-Sep-2018 1119  AST 25  ALT 13  ALKPHOS 55  BILITOT 0.7  PROT 7.0  ALBUMIN 3.7   No results for input(s): LIPASE, AMYLASE in the last 168 hours. No results for input(s): AMMONIA in the last 168 hours. CBC: Recent Labs  Lab 09-11-2018 1119  WBC 6.9  NEUTROABS 5.5  HGB 10.8*  HCT 34.5*  MCV 101.5*  PLT 208   Cardiac Enzymes: Recent Labs  Lab 09/11/2018 2202  TROPONINI 0.14*    BNP (last 3 results) Recent Labs    09-11-18 1119  BNP 170.0*    ProBNP (last 3 results) No results for input(s): PROBNP in the last 8760 hours.  CBG: Recent Labs  Lab 08/27/18 0054  GLUCAP 106*    Radiological Exams on Admission: Ct Head Wo Contrast  Result Date: 09/11/2018 CLINICAL DATA:  Altered mental status. EXAM: CT HEAD WITHOUT CONTRAST TECHNIQUE: Contiguous axial images were obtained from the base of the skull through the vertex without intravenous contrast. COMPARISON:  CT head dated November 24, 2017. FINDINGS: Brain: No evidence of acute infarction, hemorrhage, hydrocephalus, extra-axial collection or mass lesion/mass effect. Stable atrophy and chronic microvascular ischemic changes. Vascular: Calcified atherosclerosis at the skullbase. No hyperdense vessel. Skull: Normal. Negative for fracture or focal lesion. Sinuses/Orbits: No acute finding. Other: None. IMPRESSION: 1.  No acute intracranial abnormality.  Electronically Signed   By: Obie Dredge M.D.   On: Sep 11, 2018 12:31   US Renal  Result Date: 09-11-18 CLINICAL DATA:  Acute renal failure. EXAM: RENAL / URINARY TRACT ULTRASOUND COMPLETE COMPARISON:  None. FINDINGS: Right Kidney: Renal measurements: 8.2 x 4.9 x 3.8 cm = volume: 79.9 mL. Cortex is thinned with increased echogenicity. No mass or hydronephrosis visualized. Left Kidney: Renal measurements: 7.7 x 4.5 x 3.3 cm = volume: 59.3 mL. Cortex is thinned with increased echogenicity. No mass or hydronephrosis visualized. Bladder: Appears normal for degree of bladder distention. IMPRESSION: Negative for hydronephrosis.  No acute abnormality. Increased cortical echogenicity bilaterally compatible with medical renal disease. Electronically Signed   By: Drusilla Kanner M.D.   On: 09-11-2018 16:22   Dg Chest Portable 1 View  Result Date: Sep 11, 2018 CLINICAL DATA:  Altered mental status EXAM: PORTABLE CHEST 1 VIEW COMPARISON:  06/24/2018 FINDINGS: Heart size is within normal limits. Negative for heart failure or pneumonia. Lungs are well aerated and clear Chronic fracture left humeral  neck. Surgical plate fixation of right clavicle. Moderate dextroscoliosis. IMPRESSION: No active disease. Electronically Signed   By: Marlan Palauharles  Clark M.D.   On: 08/21/2018 11:34   Dg Humerus Left  Result Date: 08/28/2018 CLINICAL DATA:  Altered mental status EXAM: LEFT HUMERUS - 2+ VIEW COMPARISON:  06/21/2018 FINDINGS: Healing fracture left humeral neck. Moderate displacement and impaction unchanged from the prior study. No dislocation. Shoulder joint intact. Negative for acute fracture. IMPRESSION: Healing displaced fracture left humeral neck. Electronically Signed   By: Marlan Palauharles  Clark M.D.   On: 09/01/2018 11:36    EKG: Pending  Assessment/Plan Present on Admission: . Sepsis (HCC) secondary to acute lower UTI And acute metabolic encephalopathy: Patient meets criteria for sepsis on admission given lactic  acidosis, hypotension and end-organ damage with acute kidney injury: Suspect COVID-19 finding is incidental.  Given fluids, lactic acidosis has since resolved and sepsis found to be stabilized.  She started becoming however still more hypotensive so have ordered repeat fluid bolus and patient is still likely quite dry given degree of acute kidney injury.  Awaiting urine cultures.  Treated with IV Rocephin.  Her metabolic encephalopathy is most likely secondary to her sepsis and uremia.  . Hypertension: Holding antihypertensives due to acute kidney injury and hypotension . Acute kidney injury in the setting of CKD (chronic kidney disease), stage III (HCC): -1.12Creatinine at 1.06-1.1 baseline.  Initially presented with creatinine of 4.47 and BUN of 142.  With IV fluids, had come down to 3.52 and 129.  Continue IV fluids.  Cause felt to be secondary to sepsis  . Hypothyroidism: Continue Synthroid.  Marland Kitchen. Hyperglycemia: No previous history of diabetes.  Checking A1c.  In the meantime, low-dose sliding scale.  Marland Kitchen. Hyperkalemia: Secondary to renal failure.  As she is getting IV fluids, this should improve on its own  . Hypernatremia: Secondary to severe dehydration.  Continue IV fluid support.  Sodium does not improve, change over to D5W  . COVID-19 virus infection: As mentioned earlier, more of an incidental finding as she was at nursing facility which had outbreak.  Does not seem to be affected yet by this.  Oxygenating well on room air.  . Elevated troponin: Elevated at 0.14.  This is however in the context of her creatinine being at 3.52.  Will check EKG and repeat serial troponins  Principal Problem:   Hypertension Active Problems:   Sepsis (HCC)   AKI (acute kidney injury) (HCC)   COVID-19 virus infection   Acute lower UTI   Hypernatremia   Hyperkalemia   Hypothyroidism   CKD (chronic kidney disease), stage III (HCC)   Hyperglycemia   Elevated troponin   DVT prophylaxis: Lovenox, renally  adjusted  Code Status: DNR based on previous records and from facility  Family Communication: Will update family in the morning, no family present  Disposition Plan: Home once blood pressure stabilized, infection properly treated and assuming that her oxygenation remained stable  Consults called: None  Admission status: Given need for acute hospital services past 2 midnights, admit as inpatient    Hollice EspySendil K Carmelo Reidel MD [facility] Pager [phone]  If 7PM-7AM, please contact night-coverage [email or web address] Password [email or web address]  08/27/2018, 1:42 AM

## 2018-08-27 NOTE — Progress Notes (Addendum)
1430  Attempted to give patient Ensure.  She just holds it in her mouth and allows it to run out even with coaching to swallow.  Oral care provided.    1638  Axillary temp 103.1.  MD messaged for tylenol suppository as she isnt taking anything PO.

## 2018-08-27 NOTE — Progress Notes (Signed)
CRITICAL VALUE ALERT  Critical Value:  Troponin 0.18  Date & Time Notied:  08/27/2018 0405  Provider Notified: Rito Ehrlich  Orders Received/Actions taken: MD aware

## 2018-08-27 NOTE — Progress Notes (Addendum)
Initial Nutrition Assessment RD working remotely.  DOCUMENTATION CODES:   Not applicable  INTERVENTION:   Ensure Enlive po TID, each supplement provides 350 kcal and 20 grams of protein  Liberalize diet to regular  Patient will receive on meal trays:  Magic cup BID each supplement provides 290 kcal and 9 grams of protein  Vital Cuisine Shake once daily, each supplement provides 520 kcal and 22 grams of protein  NUTRITION DIAGNOSIS:   Inadequate oral intake related to lethargy/confusion as evidenced by meal completion < 25%.  GOAL:   Patient will meet greater than or equal to 90% of their needs  MONITOR:   PO intake, Supplement acceptance, Skin  REASON FOR ASSESSMENT:   Low Braden    ASSESSMENT:   85 yo female with PMH of HTN, CAD, & CKD-3 who was admitted with confusion, COVID-19 positive.  Per RN, patient is refusing to eat, likely d/t AMS. 0% meal completion documented at breakfast today.    Labs reviewed. Sodium 156 (H), potassium 5.2 (H), BUN 129 (H), creatinine 3.52 (H) CBG's: 106-110-106  Medications reviewed and include MVI, Flomax, Novolog.   NUTRITION - FOCUSED PHYSICAL EXAM:  unable to complete  Diet Order:   Diet Order            Diet Heart Room service appropriate? Yes; Fluid consistency: Thin  Diet effective now              EDUCATION NEEDS:   No education needs have been identified at this time  Skin:  Skin Assessment: Reviewed RN Assessment(MASD to buttocks)  Last BM:  no BM documented  Height:   Ht Readings from Last 1 Encounters:  08/30/2018 5\' 1"  (1.549 m)    Weight:   Wt Readings from Last 1 Encounters:  09/11/2018 59 kg    Ideal Body Weight:  47.7 kg  BMI:  24.6  Estimated Nutritional Needs:   Kcal:  1400-1600  Protein:  75-90 gm  Fluid:  >/= 1.4 L    Joaquin Courts, RD, LDN, CNSC Pager [phone] After Hours Pager [phone]

## 2018-08-28 DIAGNOSIS — G9341 Metabolic encephalopathy: Secondary | ICD-10-CM

## 2018-08-28 DIAGNOSIS — R739 Hyperglycemia, unspecified: Secondary | ICD-10-CM

## 2018-08-28 LAB — GLUCOSE, CAPILLARY
Glucose-Capillary: 114 mg/dL — ABNORMAL HIGH (ref 70–99)
Glucose-Capillary: 114 mg/dL — ABNORMAL HIGH (ref 70–99)
Glucose-Capillary: 115 mg/dL — ABNORMAL HIGH (ref 70–99)

## 2018-08-28 LAB — COMPREHENSIVE METABOLIC PANEL
ALT: 9 U/L (ref 0–44)
AST: 16 U/L (ref 15–41)
Albumin: 2.6 g/dL — ABNORMAL LOW (ref 3.5–5.0)
Alkaline Phosphatase: 37 U/L — ABNORMAL LOW (ref 38–126)
Anion gap: 8 (ref 5–15)
BUN: 95 mg/dL — ABNORMAL HIGH (ref 8–23)
CO2: 18 mmol/L — ABNORMAL LOW (ref 22–32)
Calcium: 7.8 mg/dL — ABNORMAL LOW (ref 8.9–10.3)
Chloride: 129 mmol/L — ABNORMAL HIGH (ref 98–111)
Creatinine, Ser: 2.49 mg/dL — ABNORMAL HIGH (ref 0.44–1.00)
GFR calc Af Amer: 19 mL/min — ABNORMAL LOW (ref >60)
GFR calc non Af Amer: 16 mL/min — ABNORMAL LOW (ref >60)
Glucose, Bld: 121 mg/dL — ABNORMAL HIGH (ref 70–99)
Potassium: 3.9 mmol/L (ref 3.5–5.1)
Sodium: 155 mmol/L — ABNORMAL HIGH (ref 135–145)
Total Bilirubin: 0.5 mg/dL (ref 0.3–1.2)
Total Protein: 5.1 g/dL — ABNORMAL LOW (ref 6.5–8.1)

## 2018-08-28 LAB — CBC WITH DIFFERENTIAL/PLATELET
Abs Immature Granulocytes: 0.17 10*3/uL — ABNORMAL HIGH (ref 0.00–0.07)
Basophils Absolute: 0 10*3/uL (ref 0.0–0.1)
Basophils Relative: 0 %
Eosinophils Absolute: 0 10*3/uL (ref 0.0–0.5)
Eosinophils Relative: 0 %
HCT: 27.9 % — ABNORMAL LOW (ref 36.0–46.0)
Hemoglobin: 8.5 g/dL — ABNORMAL LOW (ref 12.0–15.0)
Immature Granulocytes: 2 %
Lymphocytes Relative: 11 %
Lymphs Abs: 1 10*3/uL (ref 0.7–4.0)
MCH: 32 pg (ref 26.0–34.0)
MCHC: 30.5 g/dL (ref 30.0–36.0)
MCV: 104.9 fL — ABNORMAL HIGH (ref 80.0–100.0)
Monocytes Absolute: 0.2 10*3/uL (ref 0.1–1.0)
Monocytes Relative: 2 %
Neutro Abs: 7.5 10*3/uL (ref 1.7–7.7)
Neutrophils Relative %: 85 %
Platelets: 162 10*3/uL (ref 150–400)
RBC: 2.66 MIL/uL — ABNORMAL LOW (ref 3.87–5.11)
RDW: 15.6 % — ABNORMAL HIGH (ref 11.5–15.5)
WBC: 8.9 10*3/uL (ref 4.0–10.5)
nRBC: 0 % (ref 0.0–0.2)

## 2018-08-28 LAB — INTERLEUKIN-6, PLASMA: Interleukin-6, Plasma: 58 pg/mL — ABNORMAL HIGH (ref 0.0–12.2)

## 2018-08-28 LAB — MAGNESIUM: Magnesium: 2.3 mg/dL (ref 1.7–2.4)

## 2018-08-28 LAB — FERRITIN: Ferritin: 800 ng/mL — ABNORMAL HIGH (ref 11–307)

## 2018-08-28 MED ORDER — FENTANYL CITRATE (PF) 100 MCG/2ML IJ SOLN
12.5000 ug | INTRAMUSCULAR | Status: DC | PRN
  Administered 2018-08-28 – 2018-08-29 (×3): 25 ug via INTRAVENOUS
  Filled 2018-08-28 (×3): qty 2

## 2018-08-28 MED ORDER — DEXTROSE 5 % IV SOLN
INTRAVENOUS | Status: DC
  Administered 2018-08-28: 12:00:00 via INTRAVENOUS

## 2018-08-28 MED ORDER — FENTANYL CITRATE (PF) 100 MCG/2ML IJ SOLN: 25.0000 ug | INTRAMUSCULAR | Status: DC | PRN

## 2018-08-28 MED ORDER — SODIUM CHLORIDE 0.9 % IV SOLN
INTRAVENOUS | Status: DC
  Administered 2018-08-28: 16:00:00 via INTRAVENOUS

## 2018-08-28 MED ORDER — LORAZEPAM 2 MG/ML IJ SOLN: 1.0000 mg | INTRAMUSCULAR | Status: DC | PRN

## 2018-08-28 MED ORDER — HALOPERIDOL LACTATE 2 MG/ML PO CONC
0.5000 mg | ORAL | Status: DC | PRN
  Filled 2018-08-28: qty 0.3

## 2018-08-28 MED ORDER — HALOPERIDOL LACTATE 5 MG/ML IJ SOLN: 0.5000 mg | INTRAMUSCULAR | Status: DC | PRN

## 2018-08-28 MED ORDER — LORAZEPAM 2 MG/ML PO CONC: 1.0000 mg | ORAL | Status: DC | PRN

## 2018-08-28 MED ORDER — BIOTENE DRY MOUTH MT LIQD: 15.0000 mL | OROMUCOSAL | Status: DC | PRN

## 2018-08-28 MED ORDER — ATROPINE SULFATE 1 % OP SOLN
4.0000 [drp] | OPHTHALMIC | Status: DC | PRN
  Filled 2018-08-28: qty 2

## 2018-08-28 MED ORDER — OXYCODONE HCL 20 MG/ML PO CONC: 5.0000 mg | ORAL | Status: DC | PRN

## 2018-08-28 MED ORDER — LORAZEPAM 1 MG PO TABS: 1.0000 mg | ORAL_TABLET | ORAL | Status: DC | PRN

## 2018-08-28 MED ORDER — FENTANYL CITRATE (PF) 100 MCG/2ML IJ SOLN
12.5000 ug | INTRAMUSCULAR | Status: DC | PRN
  Administered 2018-08-28 (×2): 12.5 ug via INTRAVENOUS
  Filled 2018-08-28 (×2): qty 2

## 2018-08-28 MED ORDER — HALOPERIDOL 0.5 MG PO TABS
0.5000 mg | ORAL_TABLET | ORAL | Status: DC | PRN
  Filled 2018-08-28: qty 1

## 2018-08-28 MED ORDER — ENSURE ENLIVE PO LIQD: 237.0000 mL | Freq: Three times a day (TID) | ORAL | Status: DC | PRN

## 2018-08-28 MED ORDER — ORAL CARE MOUTH RINSE: 15.0000 mL | Freq: Two times a day (BID) | OROMUCOSAL | Status: DC | PRN

## 2018-08-28 MED ORDER — SODIUM CHLORIDE 0.45 % IV BOLUS
500.0000 mL | Freq: Once | INTRAVENOUS | Status: AC
  Administered 2018-08-28: 500 mL via INTRAVENOUS

## 2018-08-28 MED ORDER — OXYCODONE HCL 20 MG/ML PO CONC
5.0000 mg | ORAL | Status: DC | PRN
  Administered 2018-08-28: 5 mg via SUBLINGUAL
  Filled 2018-08-28: qty 1

## 2018-08-28 NOTE — Progress Notes (Signed)
Pt transported to visitation room without issue. Son arrived to visit mother. He was dressed in level 3 mask, eye protection, gown, gloves, shoe covers and escorted to his mothers room. He visited approximately 15 minutes. Stated he appreciated all the care providers for their work. Stated his mother had pre need arrangements at [facility] in [place], and he would call them tomorrow. Asked if she had any valuables with her, he stated she had 2 rings. Explained we did not have arrangements to store valuables, so the options would be for him to take them with him or we release them to the funeral home. He stated he did not want to take them, as he thought that might upset her. He stated it would be okay to release them to [employer] when they came to pick her up. Son assisted with doffing PPE, walked out to his car with him. Stated his mother said she was ready to go, and when she died, he would want to celebrate her life and she had lived a long life. Was told we would update him with changes in her status.

## 2018-08-28 NOTE — Progress Notes (Signed)
TEAM 1 - Stepdown/ICU TEAM  Leslie Huffman  YNW:[id number] DOB: Aug 18, 1924 DOA: 09/08/2018 PCP: Dorothey Baseman, MD    Brief Narrative:  [phone] SNF resident w/ a hx of stage III CKD, HTN, and CAD who was transported to [facility] ED 5/14 with a few days of confusion. In the ED she was found to be hypotensive w/ severe acute renal failure (BUN 142 / crt 4.47). CT head was unremarkable. Her workup was c/w UTI w/ sepsis, and TME. She was found to be + for SARS CoV-2 (on a repeat test - initial test was negative). Despite this she had a negative CXR and no hypoxia .   Significant Events: 5/14 sent to [facility] ED 5/15 admit to [facility]   COVID-19 specific Treatment: none  Subjective: Continues to have fevers as high as 103.6. BP dropping.  Appears comfortable at times and is moaning frequently.  I spoke with the patient's son (her POA) via telephone.  He made it very clear that the patient did not wish for ongoing aggressive care.  He made it clear that she would wish for comfort focused care in this current situation.  I have explained to him that she has indeed failed medical interventions and that I agree that comfort focused care is unquestionably most appropriate at this time.  We will transition to comfort focused care.  It is expected that the patient will likely die within the next 24-48 hours.  Assessment & Plan:  Comfort Care Care has been transitioned to a focus on comfort - son to arrange via the unit Charge RN for a scheduled visit as per the [facility] Visitation Policy   Klebsiella oxytoca UTI w/ Sepsis   SARS CoV-2 +  Acute kidney failure   Hypernatremia   Hyperkalemia  Elevated troponin  Most c/w demand ischemia - no significant peak  HTN  Hypothryroidism   Macrocytosis  Hyperglycemia No hx of DM - A1c 6.4   DVT prophylaxis: comfort care  Code Status: DNR - NO CODE Family Communication:  Spoke w/ son via telephone  Disposition Plan: med/surg bed - comfort  focused care   Consultants:  none  Antimicrobials:  Cefepime 5/14 > 5/16  Objective: Blood pressure (!) 108/52, pulse 90, temperature (!) 103.6 F (39.8 C), temperature source Axillary, resp. rate (!) 29, SpO2 95 %.  Intake/Output Summary (Last 24 hours) at 08/28/2018 0913 Last data filed at 08/28/2018 0537 Gross per 24 hour  Intake 0 ml  Output 1550 ml  Net -1550 ml   There were no vitals filed for this visit.  Examination: General: No acute respiratory distress Lungs: mild basilar crackles  Cardiovascular: tachycardic - 3/6 holosystolic M  Abdomen: NS, soft, bs+, no mass  Extremities: no C/C/E B LE    CBC: Recent Labs  Lab 08/27/2018 1119 08/27/18 0105 08/28/18 0118  WBC 6.9 8.4 8.9  NEUTROABS 5.5  --  7.5  HGB 10.8* 9.6* 8.5*  HCT 34.5* 31.4* 27.9*  MCV 101.5* 104.7* 104.9*  PLT 208 183 162   Basic Metabolic Panel: Recent Labs  Lab 08/15/2018 1819 08/27/18 0105 08/27/18 2022 08/28/18 0118  NA 156*  --  155* 155*  K 5.2*  --  4.0 3.9  CL 126*  --  128* 129*  CO2 22  --  17* 18*  GLUCOSE 138*  --  114* 121*  BUN 129*  --  95* 95*  CREATININE 3.52* 3.35* 2.53* 2.49*  CALCIUM 8.6*  --  8.0* 7.8*  MG  --   --  2.3 2.3   GFR: Estimated Creatinine Clearance: 12.2 mL/min (A) (by C-G formula based on SCr of 2.49 mg/dL (H)).  Liver Function Tests: Recent Labs  Lab 08/15/2018 1119 08/28/18 0118  AST 25 16  ALT 13 9  ALKPHOS 55 37*  BILITOT 0.7 0.5  PROT 7.0 5.1*  ALBUMIN 3.7 2.6*    Cardiac Enzymes: Recent Labs  Lab 08/22/2018 2202 08/27/18 0105 08/27/18 0640  TROPONINI 0.14* 0.18* 0.16*    HbA1C: Hgb A1c MFr Bld  Date/Time Value Ref Range Status  08/27/2018 01:05 AM 6.4 (H) 4.8 - 5.6 % Final    Comment:    (NOTE) Pre diabetes:          5.7%-6.4% Diabetes:              >6.4% Glycemic control for   <7.0% adults with diabetes   09/03/2018 10:32 PM 6.4 (H) 4.8 - 5.6 % Final    Comment:    (NOTE) Pre diabetes:          5.7%-6.4% Diabetes:               >6.4% Glycemic control for   <7.0% adults with diabetes     CBG: Recent Labs  Lab 08/27/18 1158 08/27/18 1622 08/27/18 2022 08/28/18 0601 08/28/18 0730  GLUCAP 106* 116* 110* 114* 114*    Recent Results (from the past 240 hour(s))  Urine Culture     Status: Abnormal (Preliminary result)   Collection Time: 08/21/2018 11:19 AM  Result Value Ref Range Status   Specimen Description   Final    URINE, RANDOM Performed at [facility], [address] [place], [place] [address]    Special Requests   Final    NONE Performed at [facility], [address] [place], [place] [address]    Culture (A)  Final    >=100,000 COLONIES/mL KLEBSIELLA OXYTOCA SUSCEPTIBILITIES TO FOLLOW Performed at [facility], [address] [place], [place] [address]    Report Status PENDING  Incomplete  SARS Coronavirus 2 [facility](Hospital order, Performed in [facility] hospital lab)     Status: None   Collection Time: 08/13/2018 11:20 AM  Result Value Ref Range Status   SARS Coronavirus 2 NEGATIVE NEGATIVE Final    Comment: (NOTE) If result is NEGATIVE SARS-CoV-2 target nucleic acids are NOT DETECTED. The SARS-CoV-2 RNA is generally detectable in upper and lower  respiratory specimens during the acute phase of infection. The lowest  concentration of SARS-CoV-2 viral copies this assay can detect is 250  copies / mL. A negative result does not preclude SARS-CoV-2 infection  and should not be used as the sole basis for treatment or other  patient management decisions.  A negative result may occur with  improper specimen collection / handling, submission of specimen other  than nasopharyngeal swab, presence of viral mutation(s) within the  areas targeted by this assay, and inadequate number of viral copies  (<250 copies / mL). A negative result must be combined with clinical  observations, patient history, and epidemiological information. If result is POSITIVE SARS-CoV-2  target nucleic acids are DETECTED. The SARS-CoV-2 RNA is generally detectable in upper and lower  respiratory specimens dur ing the acute phase of infection.  Positive  results are indicative of active infection with SARS-CoV-2.  Clinical  correlation with patient history and other diagnostic information is  necessary to determine patient infection status.  Positive results do  not  rule out bacterial infection or co-infection with other viruses. If result is PRESUMPTIVE POSTIVE SARS-CoV-2 nucleic acids MAY BE PRESENT.   A presumptive positive result was obtained on the submitted specimen  and confirmed on repeat testing.  While 2019 novel coronavirus  (SARS-CoV-2) nucleic acids may be present in the submitted sample  additional confirmatory testing may be necessary for epidemiological  and / or clinical management purposes  to differentiate between  SARS-CoV-2 and other Sarbecovirus currently known to infect humans.  If clinically indicated additional testing with an alternate test  methodology [phone]) is advised. The SARS-CoV-2 RNA is generally  detectable in upper and lower respiratory sp ecimens during the acute  phase of infection. The expected result is Negative. Fact Sheet for Patients:  BoilerBrush.com.cy Fact Sheet for Healthcare Providers: https://pope.com/ This test is not yet approved or cleared by the [place] FDA and has been authorized for detection and/or diagnosis of SARS-CoV-2 by FDA under an Emergency Use Authorization (EUA).  This EUA will remain in effect (meaning this test can be used) for the duration of the COVID-19 declaration under Section 564(b)(1) of the Act, 21 U.S.C. section 360bbb-3(b)(1), unless the authorization is terminated or revoked sooner. Performed at [facility], [address] [place], [place] [address]   SARS Coronavirus 2 [facility] order, Performed in [facility] hospital  lab)     Status: Abnormal   Collection Time: 09/06/2018  4:21 PM  Result Value Ref Range Status   SARS Coronavirus 2 POSITIVE (A) NEGATIVE Final    Comment: RESULT CALLED TO, READ BACK BY AND VERIFIED WITH: RACHAEL HAIDEN 08/20/2018 @ 1758  MLK (NOTE) If result is NEGATIVE SARS-CoV-2 target nucleic acids are NOT DETECTED. The SARS-CoV-2 RNA is generally detectable in upper and lower  respiratory specimens during the acute phase of infection. The lowest  concentration of SARS-CoV-2 viral copies this assay can detect is 250  copies / mL. A negative result does not preclude SARS-CoV-2 infection  and should not be used as the sole basis for treatment or other  patient management decisions.  A negative result may occur with  improper specimen collection / handling, submission of specimen other  than nasopharyngeal swab, presence of viral mutation(s) within the  areas targeted by this assay, and inadequate number of viral copies  (<250 copies / mL). A negative result must be combined with clinical  observations, patient history, and epidemiological information. If result is POSITIVE SARS-CoV-2 target nucleic acids are DETECTED. Th e SARS-CoV-2 RNA is generally detectable in upper and lower  respiratory specimens during the acute phase of infection.  Positive  results are indicative of active infection with SARS-CoV-2.  Clinical  correlation with patient history and other diagnostic information is  necessary to determine patient infection status.  Positive results do  not rule out bacterial infection or co-infection with other viruses. If result is PRESUMPTIVE POSTIVE SARS-CoV-2 nucleic acids MAY BE PRESENT.   A presumptive positive result was obtained on the submitted specimen  and confirmed on repeat testing.  While 2019 novel coronavirus  (SARS-CoV-2) nucleic acids may be present in the submitted sample  additional confirmatory testing may be necessary for epidemiological  and / or clinical  management purposes  to differentiate between  SARS-CoV-2 and other Sarbecovirus currently known to infect humans.  If clinically indicated additional testing with an alternate test  methodology [phone]) is  advised. The SARS-CoV-2 RNA is generally  detectable in upper and lower respiratory specimens during the acute  phase  of infection. The expected result is Negative. Fact Sheet for Patients:  BoilerBrush.com.cy Fact Sheet for Healthcare Providers: https://pope.com/ This test is not yet approved or cleared by the [place] FDA and has been authorized for detection and/or diagnosis of SARS-CoV-2 by FDA under an Emergency Use Authorization (EUA).  This EUA will remain in effect (meaning this test can be used) for the duration of the COVID-19 declaration under Section 564(b)(1) of the Act, 21 U.S.C. section 360bbb-3(b)(1), unless the authorization is terminated or revoked sooner. Performed at [facility], [address] [place], [place] [address]   MRSA PCR Screening     Status: None   Collection Time: 08/27/18  6:15 PM  Result Value Ref Range Status   MRSA by PCR NEGATIVE NEGATIVE Final    Comment:        The GeneXpert MRSA Assay (FDA approved for NASAL specimens only), is one component of a comprehensive MRSA colonization surveillance program. It is not intended to diagnose MRSA infection nor to guide or monitor treatment for MRSA infections. Performed at [facility], [address], [place], [place] [address]      Scheduled Meds: . aspirin EC  81 mg Oral Daily  . feeding supplement (ENSURE ENLIVE)  237 mL Oral TID BM  . heparin injection (subcutaneous)  5,000 Units Subcutaneous Q8H  . insulin aspart  0-9 Units Subcutaneous TID WC  . levothyroxine  50 mcg Oral QAC breakfast  . mouth rinse  15 mL Mouth Rinse BID  . multivitamin  1 tablet Oral Daily  . sodium chloride flush  3 mL  Intravenous Q12H  . tamsulosin  0.4 mg Oral Daily      LOS: 2 days   Lonia Blood, MD [facility] Office  [phone] Pager - Text Page per Amion  If 7PM-7AM, please contact night-coverage per Amion 08/28/2018, 9:13 AM

## 2018-08-29 LAB — URINE CULTURE: Culture: >=100000 — AB

## 2018-09-13 NOTE — Progress Notes (Signed)
Spoke with patient's son this morning to give update on his mother's condition.  He is very appreciative of the care that we have given his mother.  Bernie Covey RN

## 2018-09-13 NOTE — Discharge Summary (Addendum)
   Death Summary   Leslie Huffman LEX:[id number] DOB: 11/29/1924 DOA: Sep 25, 2018  PCP: Dorothey Baseman, MD  Admit date: 09/25/18 Date of Death: 09/28/18  Final Diagnoses:  Klebsiella oxytoca UTI/Pyelonephritis w/ Sepsis  SARS CoV-2 + - COVID-19 Acute kidney failure  Hypernatremia  Hyperkalemia Elevated troponin  HTN Hypothryroidism  Macrocytosis Hyperglycemia  History of present illness:  85yo SNF resident w/ a hx of stage III CKD, HTN, and CAD who was transported to [facility] ED 09/25/2022 with a few days of confusion. In the ED she was found to be hypotensive w/ severe acute renal failure (BUN 142 / crt 4.47).CT head was unremarkable. Her workup was c/w Pyelonephritis/UTI w/ sepsis, and TME. She was found to be + for SARS CoV-2 (on a repeat test - initial test was negative).  Hospital Course:  The patient was transferred from [facility] emergency room to [facility] after she was confirmed to have COVID-19.  It had already been established that only conservative medical care would be administered and that the patient will be no code/DNR.  Volume resuscitation was continued.  Empiric antibiotic was dosed.  Unfortunately within 24 hours of her admission to [facility] it became clear that she was not improving.  She began to develop respiratory distress in addition to her ongoing pyelonephritis with sepsis.  The patient's son, who is her power of attorney, reported that she had made it very clear she would not wish to undergo aggressive medical therapy.  She was able to speak with him and explained to him that she was "ready to go home", meaning she was ready to die.  Her wishes were respected and the decision was made to transition to comfort care.  The medical team and the son all agree this was most appropriate.  Arrangements were made and the patient's son was able to visit her wearing all appropriate protective gear.  On 09/28/2018 at 10 AM the patient  died peacefully in her room.  I examined her at the time of her death and pronounced her death.   Signed:  Lonia Blood  [facility] Sep 28, 2018, 11:36 AM

## 2018-09-13 NOTE — Progress Notes (Addendum)
0903  Resting quietly in bed.  Cheyne-Stokes respirations.  Repositioned for comfort.  Mouth care provided.    1000  Patient asystole on the monitor per telemetry report.  Upon assessment all respirations had ceased.  No heart rate audible or palpable.  Dr. Sharon Seller, MD at bedside to pronounce.  Tim, patients son, notified.  Charge nurse aware and notified all other parties.

## 2018-09-13 DEATH — deceased

## 2019-05-28 IMAGING — CT CT HEAD WITHOUT CONTRAST
3 series · 16 of 47 positions shown, 19 images · non-contrast
Comparison: CT head dated November 24, 2017.

CLINICAL DATA: Altered mental status.

EXAM:
CT HEAD WITHOUT CONTRAST
TECHNIQUE: Contiguous axial images were obtained from the base of the skull
through the vertex without intravenous contrast.

[Series 2: head wo · axial · 0.40mm/px · z∈[-146,-11]mm · 10 of 33 slices shown, 13 images]
[im 3/33  brain]
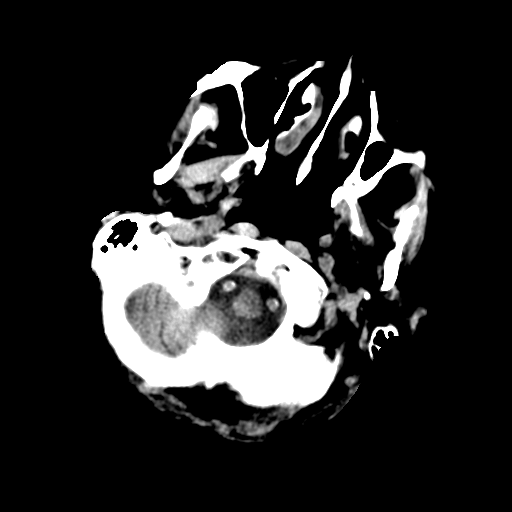
[im 3/33  bone]
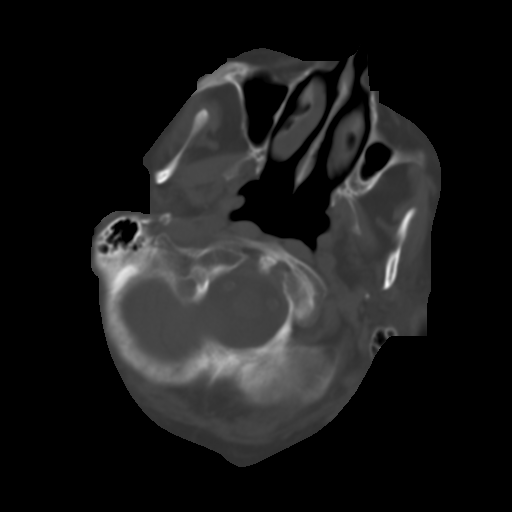
[im 6/33  brain]
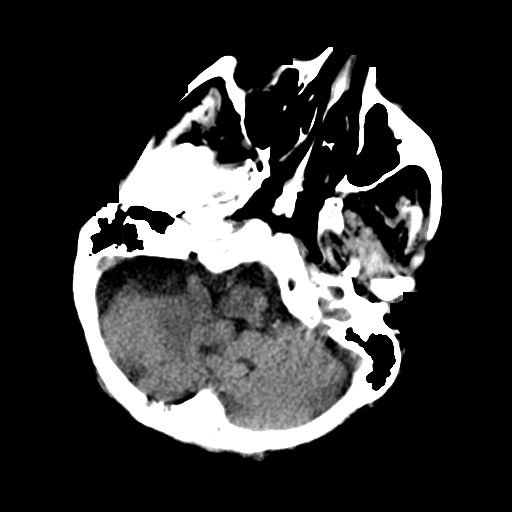
[im 9/33  brain]
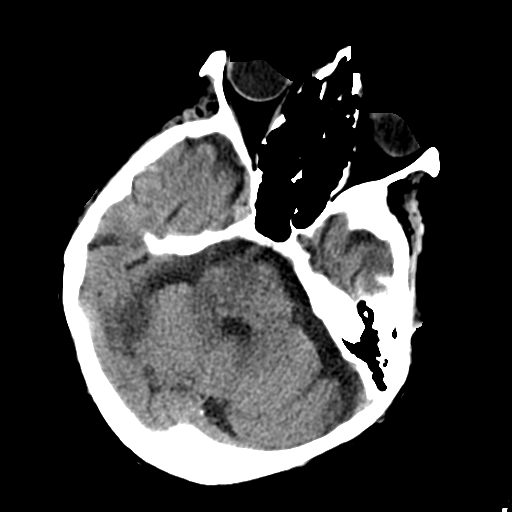
[im 12/33  brain]
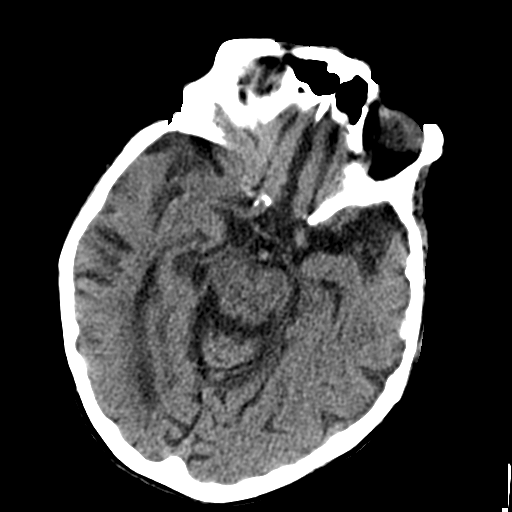
[im 15/33  brain]
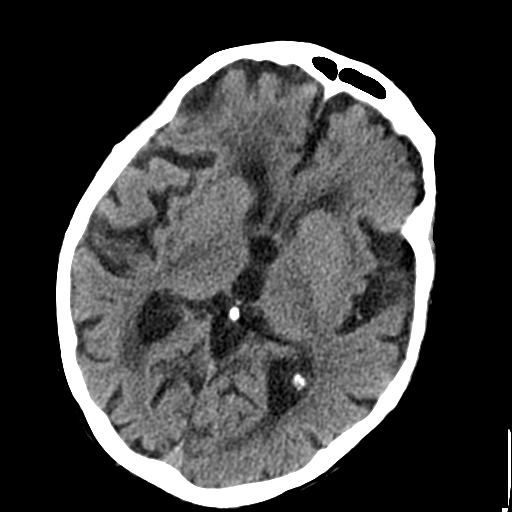
[im 15/33  bone]
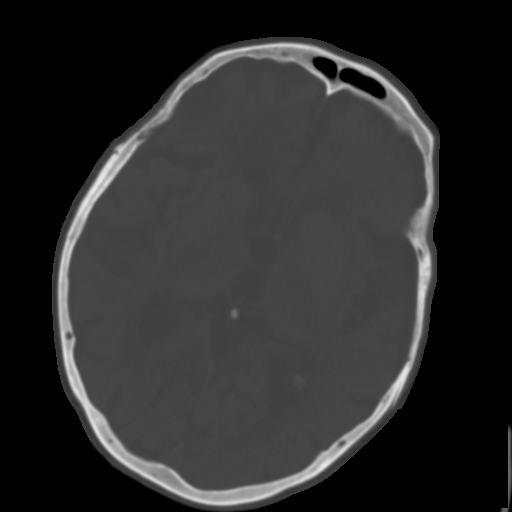
[im 18/33  brain]
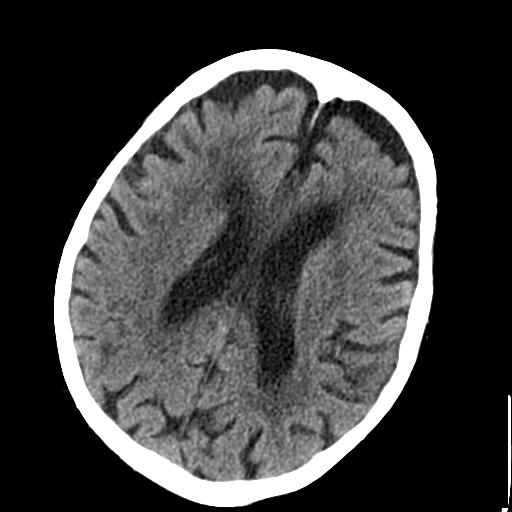
[im 21/33  brain]
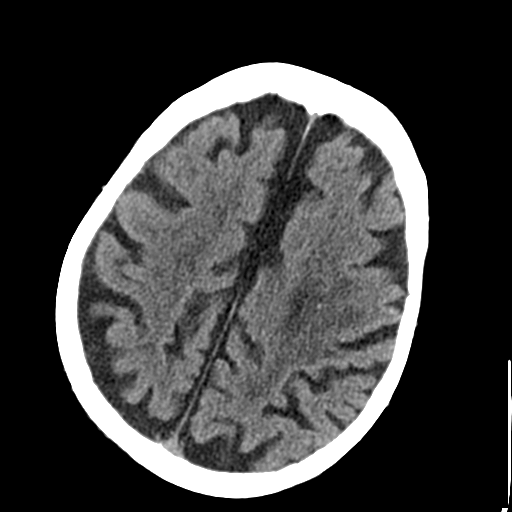
[im 25/33  brain]
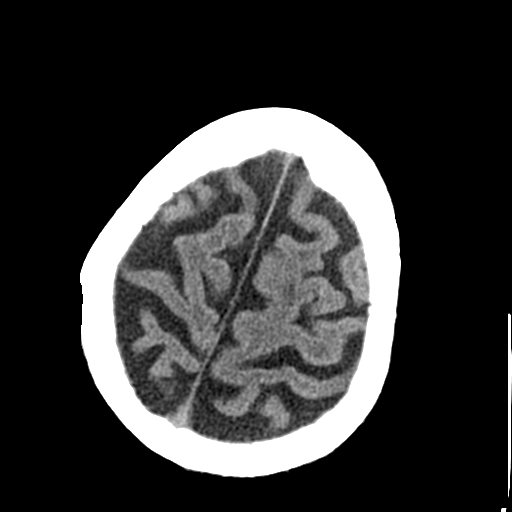
[im 27/33  brain]
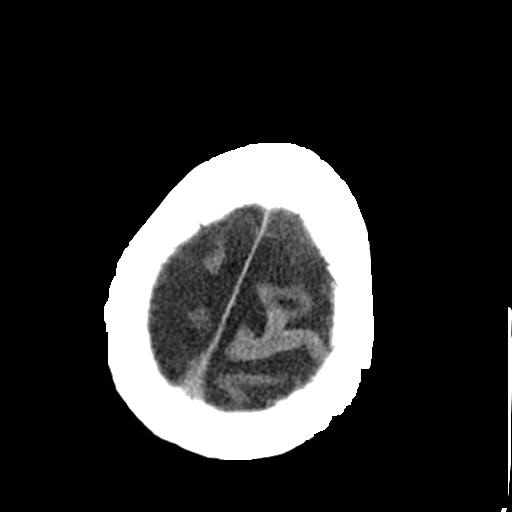
[im 27/33  bone]
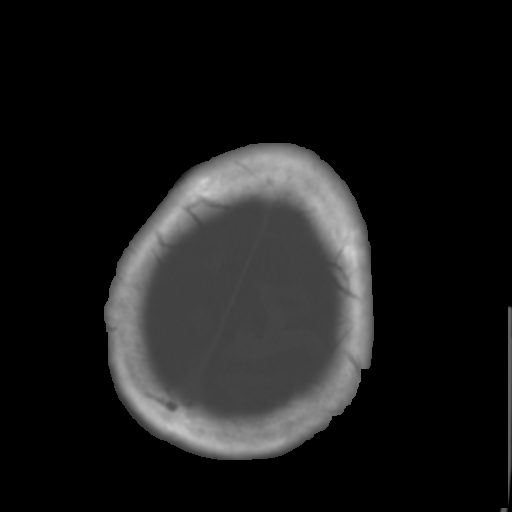
[im 30/33  brain]
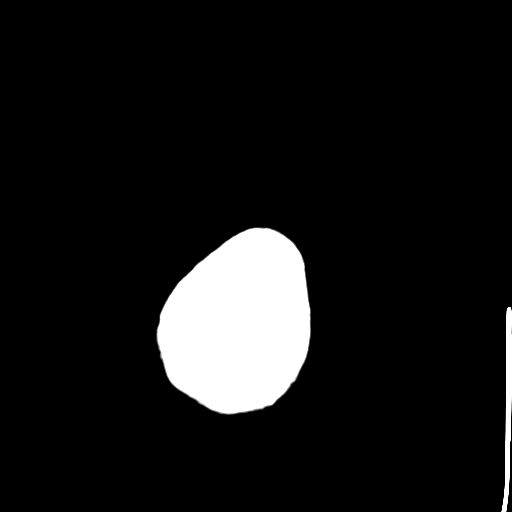

[Series 4: sagittal soft tissue · sagittal · 0.31mm/px · 3 of 59 slices shown]
[im 20/59  brain]
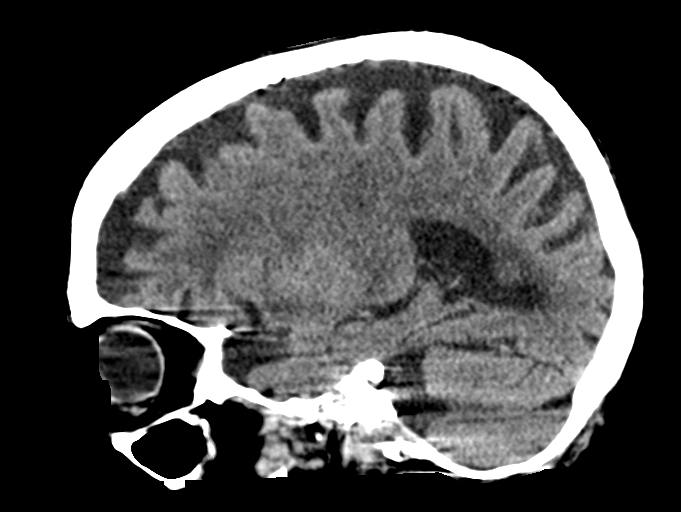
[im 30/59  brain]
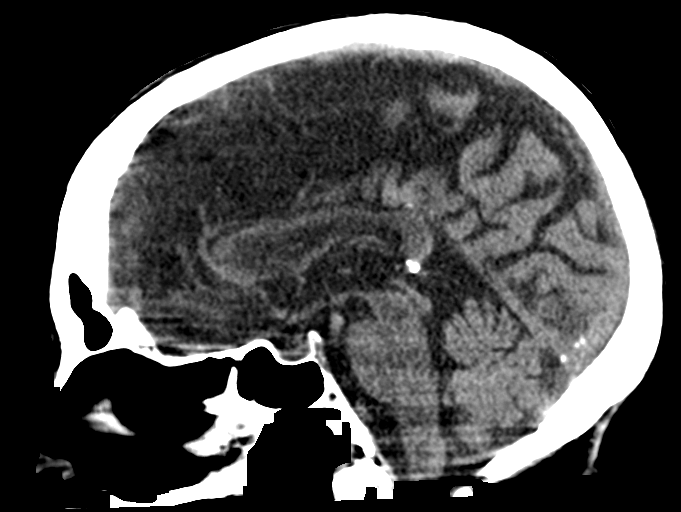
[im 39/59  brain]
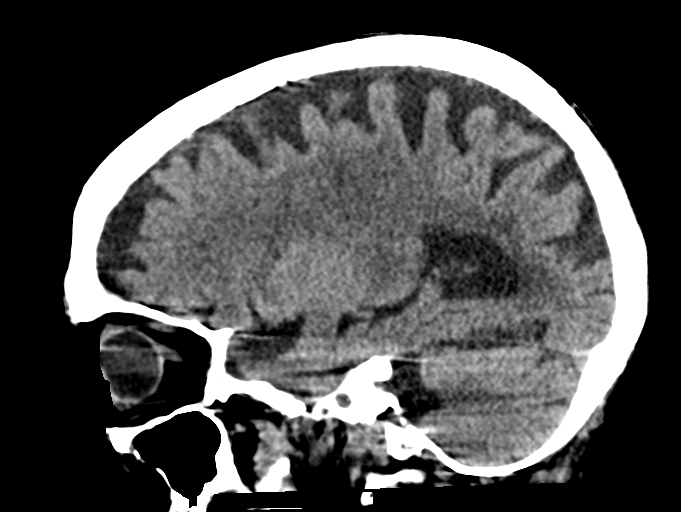

[Series 5: coronal soft tissue · coronal · 0.32mm/px · 3 of 68 slices shown]
[im 23/68  brain]
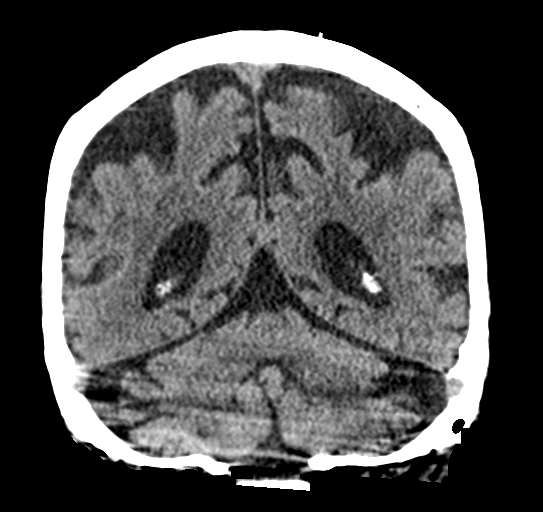
[im 30/68  brain]
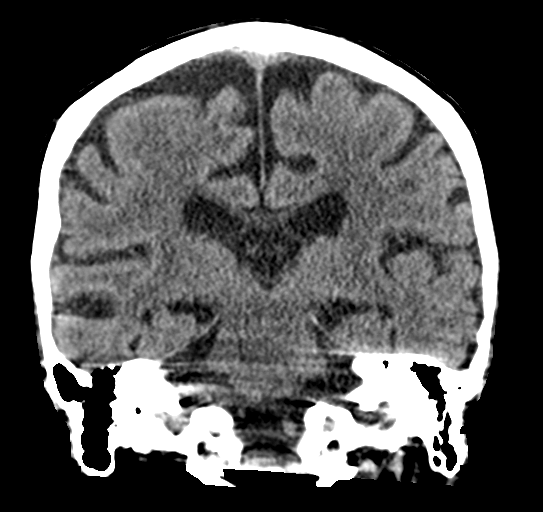
[im 38/68  brain]
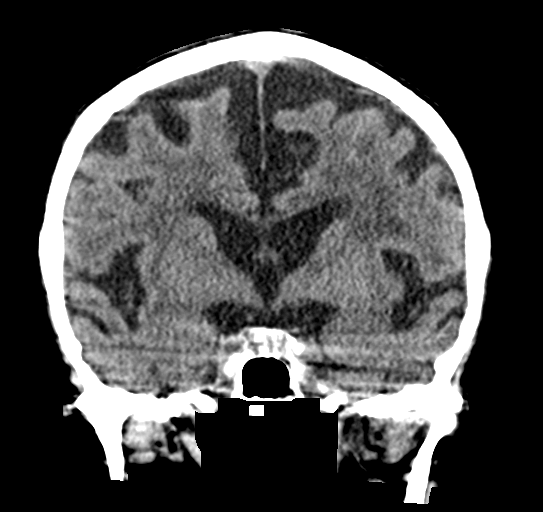

[16 of 47 positions shown; findings below may reference images not displayed]

FINDINGS: Brain: No evidence of acute infarction, hemorrhage, hydrocephalus,
extra-axial collection or mass lesion/mass effect. Stable atrophy
and chronic microvascular ischemic changes.

Vascular: Calcified atherosclerosis at the skullbase. No hyperdense
vessel.

Skull: Normal. Negative for fracture or focal lesion.

Sinuses/Orbits: No acute finding.

Other: None.
IMPRESSION: 1.  No acute intracranial abnormality.

## 2019-09-11 IMAGING — US US RENAL
1 series · 14 of 25 positions shown · non-contrast
Comparison: None.

CLINICAL DATA: Acute renal failure.

EXAM:
RENAL / URINARY TRACT ULTRASOUND COMPLETE

[Series 1: us renal · 14 of 45 slices shown]
[im 1/45]
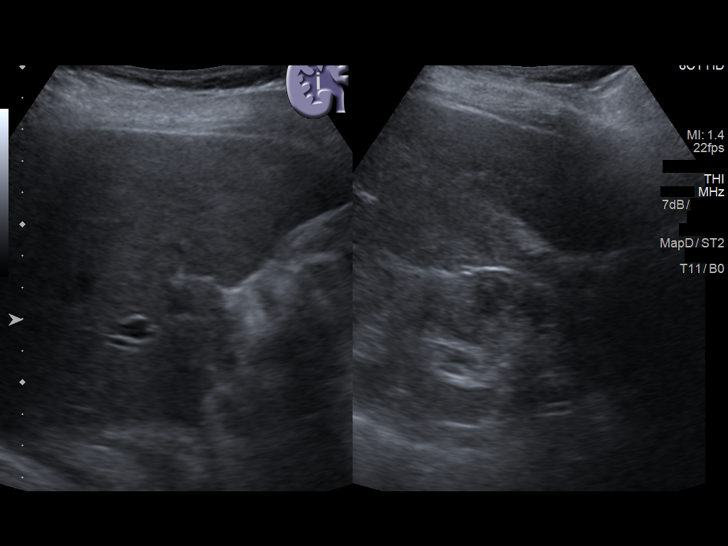
[im 4/45]
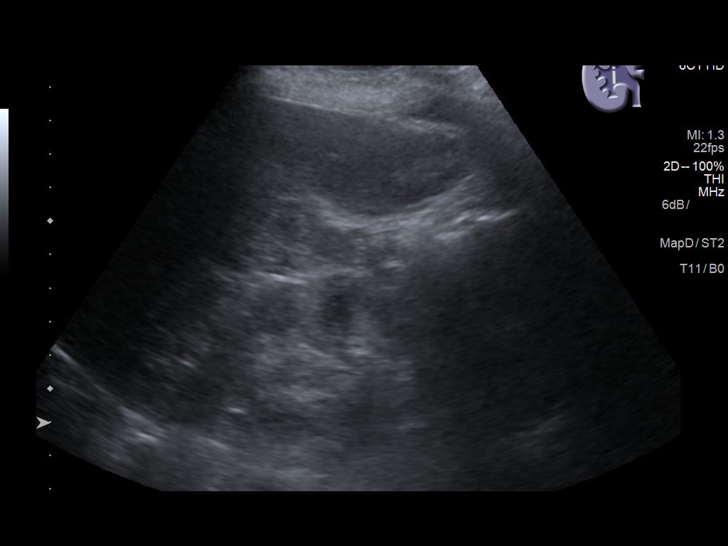
[im 8/45]
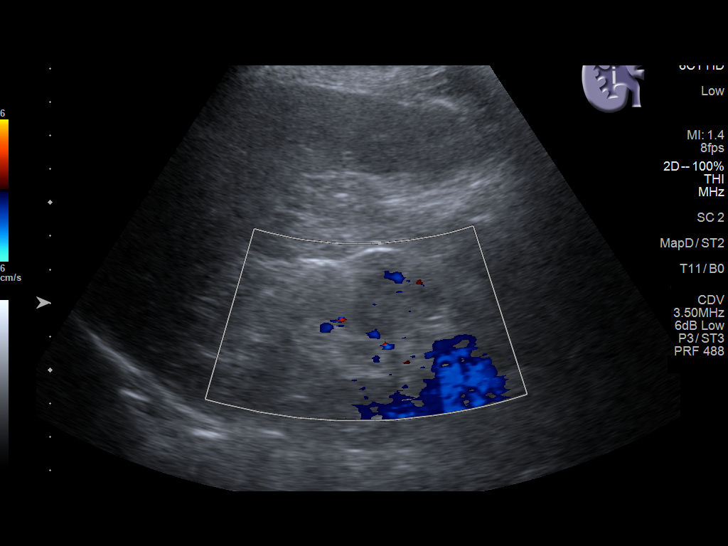
[im 12/45]
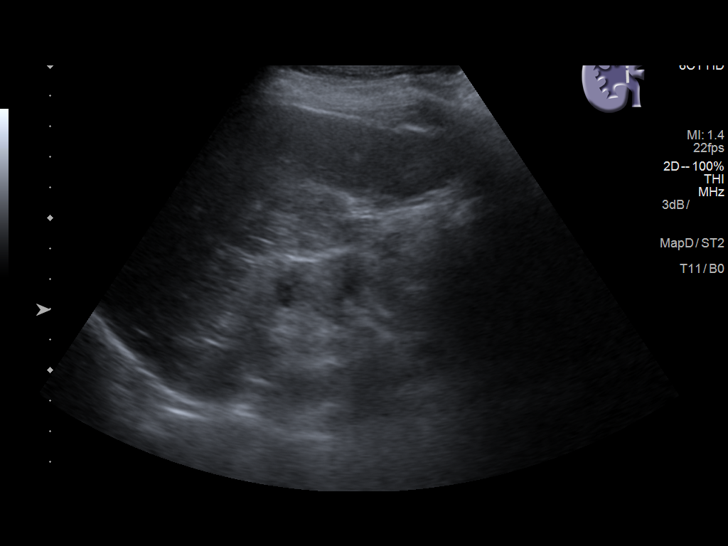
[im 15/45]
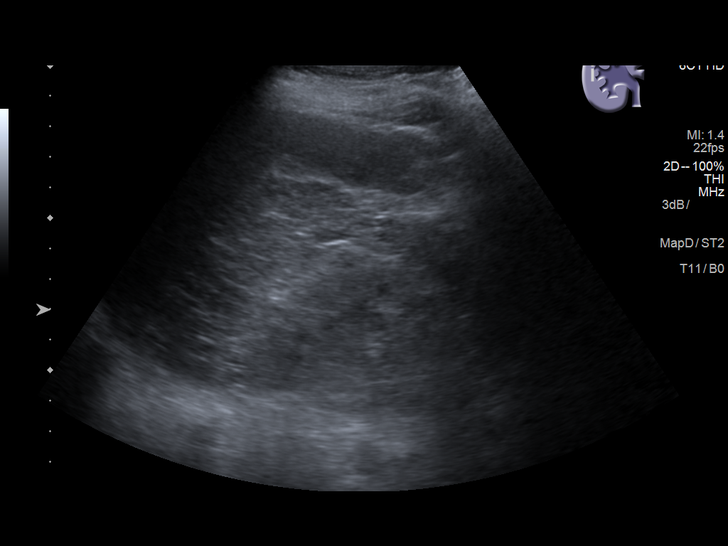
[im 17/45]
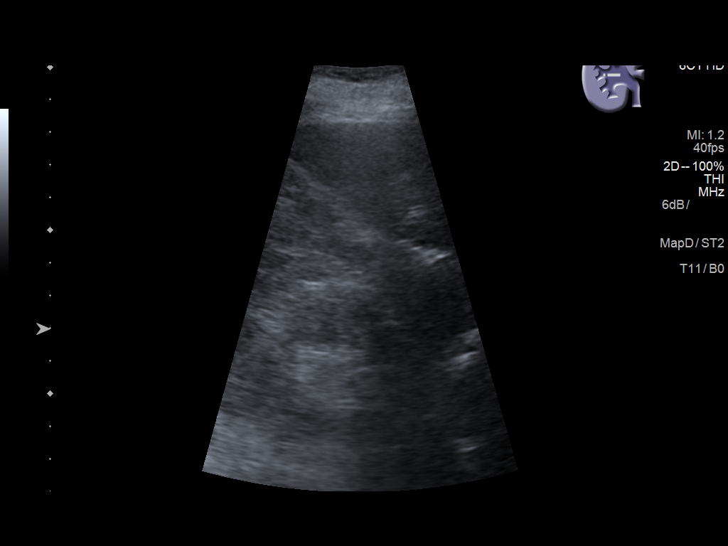
[im 21/45]
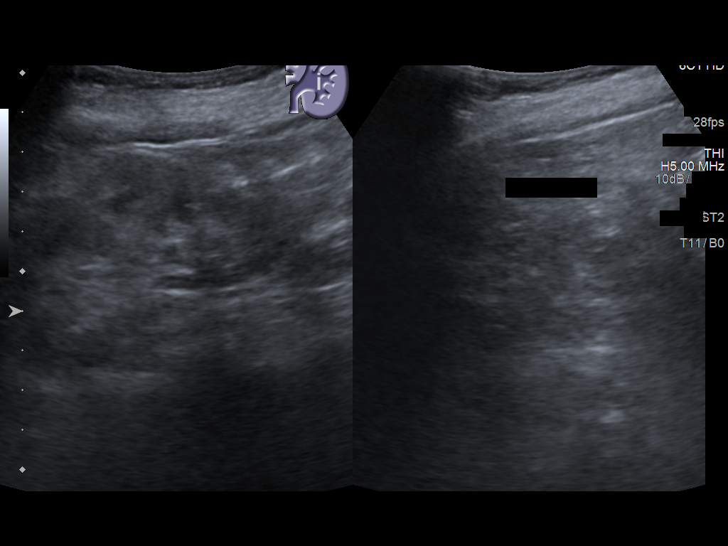
[im 24/45]
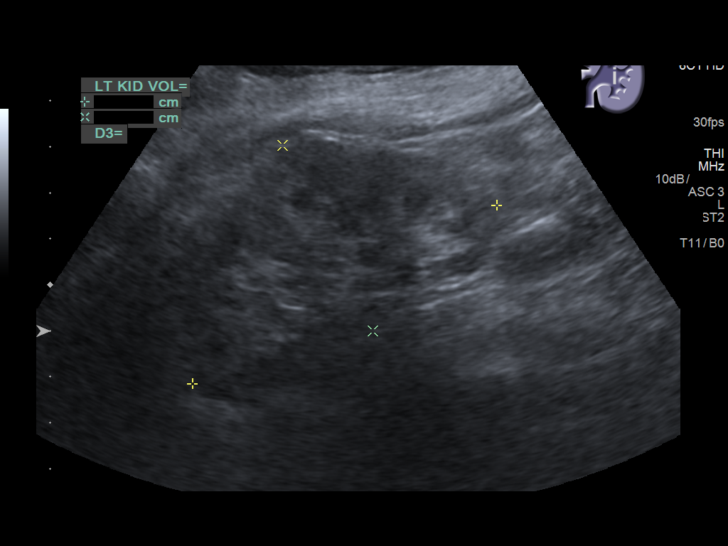
[im 28/45]
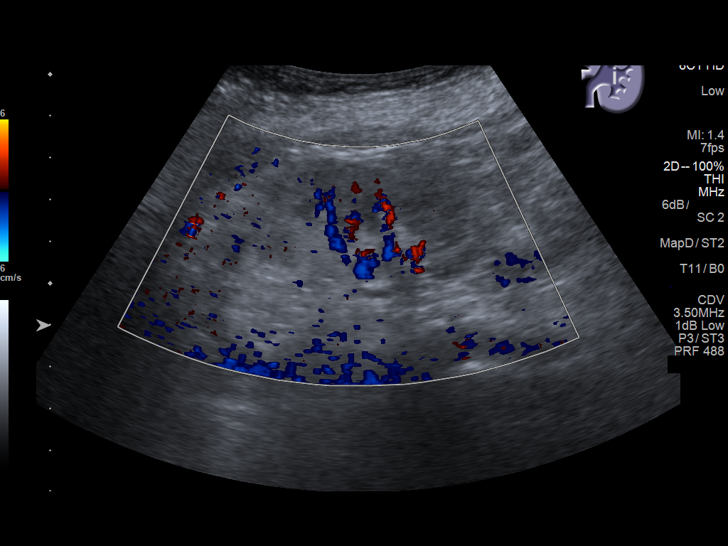
[im 30/45]
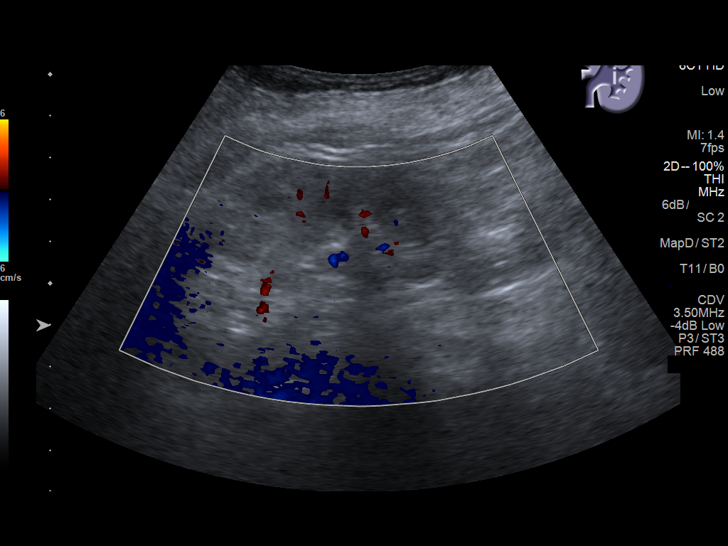
[im 34/45]
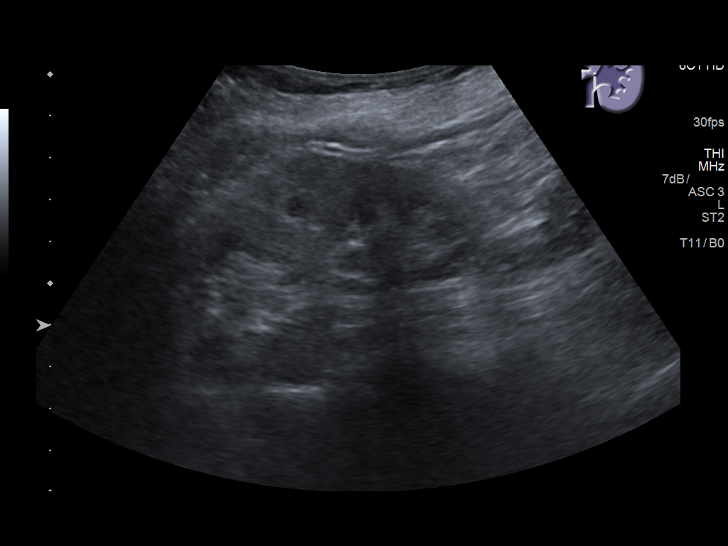
[im 37/45]
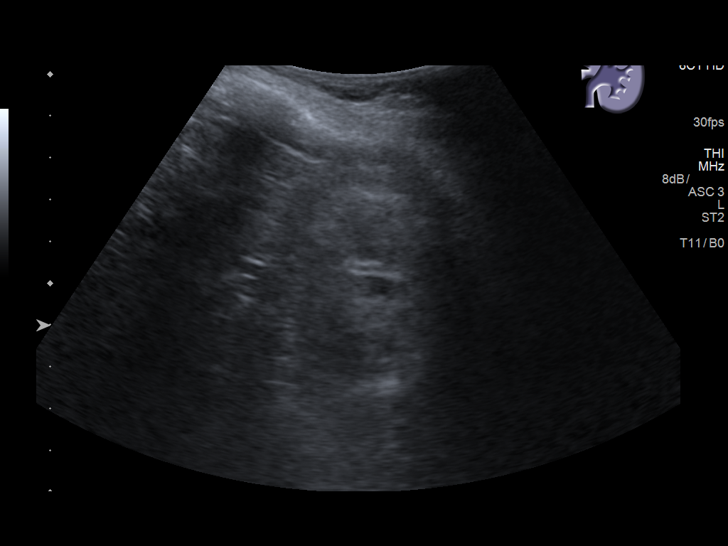
[im 41/45]
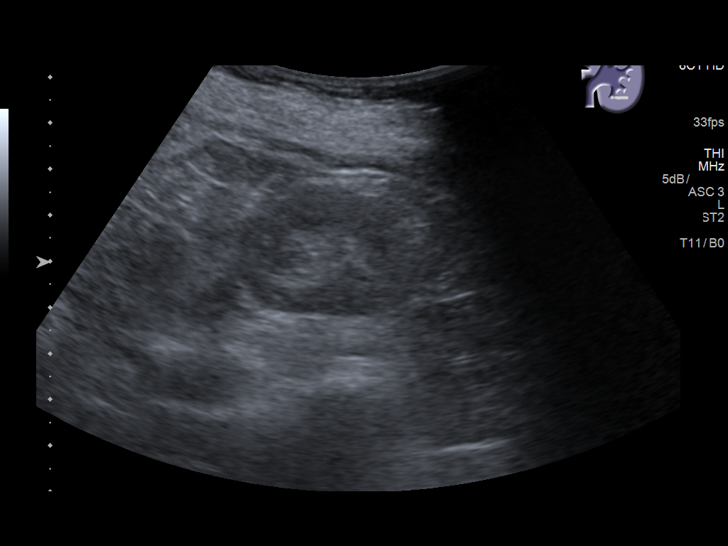
[im 45/45]
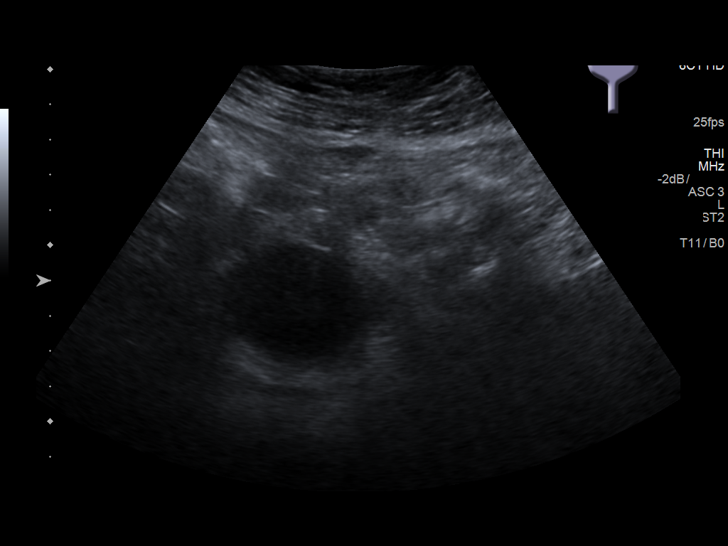

[14 of 25 positions shown; findings below may reference images not displayed]

FINDINGS: Right Kidney:

Renal measurements: 8.2 x 4.9 x 3.8 cm = volume: 79.9 mL. Cortex is
thinned with increased echogenicity. No mass or hydronephrosis
visualized.

Left Kidney:

Renal measurements: 7.7 x 4.5 x 3.3 cm = volume: 59.3 mL. Cortex is
thinned with increased echogenicity. No mass or hydronephrosis
visualized.

Bladder:

Appears normal for degree of bladder distention.
IMPRESSION: Negative for hydronephrosis.  No acute abnormality.

Increased cortical echogenicity bilaterally compatible with medical
renal disease.
